# Patient Record
Sex: Female | Born: 1995 | Race: Black or African American | Hispanic: No | Smoking: Never smoker
Health system: Southern US, Community
[De-identification: ages and names within clinical notes are randomized; demographics above are authoritative.]

## PROBLEM LIST (undated history)

## (undated) ENCOUNTER — Inpatient Hospital Stay (HOSPITAL_COMMUNITY): Payer: Self-pay

## (undated) DIAGNOSIS — A749 Chlamydial infection, unspecified: Secondary | ICD-10-CM

## (undated) DIAGNOSIS — E669 Obesity, unspecified: Secondary | ICD-10-CM

## (undated) DIAGNOSIS — Z789 Other specified health status: Secondary | ICD-10-CM

## (undated) HISTORY — DX: Obesity, unspecified: E66.9

## (undated) HISTORY — PX: NO PAST SURGERIES: SHX2092

## (undated) HISTORY — DX: Other specified health status: Z78.9

---
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
## 2017-07-10 ENCOUNTER — Encounter: Payer: Self-pay | Admitting: General Practice

## 2017-07-10 ENCOUNTER — Inpatient Hospital Stay (HOSPITAL_COMMUNITY)
Admission: AD | Admit: 2017-07-10 | Discharge: 2017-07-10 | Disposition: A | Discharge status: Home / Self Care | Payer: Medicaid Other | Source: Ambulatory Visit | Attending: Family Medicine | Admitting: Family Medicine

## 2017-07-10 ENCOUNTER — Ambulatory Visit (INDEPENDENT_AMBULATORY_CARE_PROVIDER_SITE_OTHER): Payer: Self-pay | Admitting: General Practice

## 2017-07-10 DIAGNOSIS — Z3201 Encounter for pregnancy test, result positive: Secondary | ICD-10-CM

## 2017-07-10 DIAGNOSIS — N912 Amenorrhea, unspecified: Secondary | ICD-10-CM

## 2017-07-10 LAB — POCT PREGNANCY, URINE: PREG TEST UR: POSITIVE — AB

## 2017-07-10 NOTE — Progress Notes (Signed)
Patient here for UPT today. UPT +. Patient reports first positive home test yesterday. Reports LMP 05/31/17 5563w5d today EDD 03/07/18. Patient informed. Patient denies current use of meds/vitamins. Encouraged her to start PNV & care. Patient wishes to come here. New OB packet & baby scripts info given as well as pregnancy verification letter. Patient asked when she could receive first ultrasound. I told patient it was possible we would do an informal ultrasound in office the day of her new OB appt but I wasn't certain. Explained to patient she isn't far enough along right now for ultrasound. Patient verbalized understanding and had no questions

## 2017-07-10 NOTE — MAU Provider Note (Signed)
[name] is a 21 y.o. No obstetric history on file. who present to MAU today for pregnancy confirmation. She denies abdominal pain or vaginal bleeding.   BP 128/71 (BP Location: Left Arm)   Pulse 82   Temp 98.3 F (36.8 C) (Oral)   Resp 16   Wt 204 lb 0.6 oz (92.6 kg)   LMP 05/28/2017   SpO2 100%  CONSTITUTIONAL: Well-developed, well-nourished female in no acute distress.  CARDIOVASCULAR: Normal heart rate noted RESPIRATORY: Effort and breath sounds normal GASTROINTESTINAL:Soft, no distention noted.  No tenderness, rebound or guarding.  SKIN: Skin is warm and dry. No rash noted. Not diaphoretic. No erythema. No pallor. PSYCHIATRIC: Normal mood and affect. Normal behavior. Normal judgment and thought content.  MDM Medical screening exam complete Patient does not endorse any symptoms concerning for ectopic pregnancy or pregnancy related complication today.   A:  Amenorrhea  P: Discharge home Patient advised that she can present as a walk-in to [facility] for a pregnancy test M-Th between 8am-4pm or Friday between 8am -11am Reasons to return to MAU reviewed  Patient may return to MAU as needed or if her condition were to change or worsen  Judeth HornLawrence, Angelica Gulick, NP 07/10/2017 10:26 AM

## 2017-07-10 NOTE — MAU Note (Addendum)
Patient seeking pregnancy test. +HPT  +vaginal discharge White Thin No odor But states is not concerned about it.  +lower abdominal pain Cramping Intermittent Started last week of July No pain at present time.  LMP June 25

## 2017-07-30 ENCOUNTER — Inpatient Hospital Stay (HOSPITAL_COMMUNITY)
Admission: AD | Admit: 2017-07-30 | Discharge: 2017-07-30 | Disposition: A | Discharge status: Home / Self Care | Payer: Medicaid Other | Source: Ambulatory Visit | Attending: Obstetrics & Gynecology | Admitting: Obstetrics & Gynecology

## 2017-07-30 DIAGNOSIS — O99341 Other mental disorders complicating pregnancy, first trimester: Secondary | ICD-10-CM

## 2017-07-30 DIAGNOSIS — F419 Anxiety disorder, unspecified: Secondary | ICD-10-CM | POA: Diagnosis present

## 2017-07-30 DIAGNOSIS — Z3A01 Less than 8 weeks gestation of pregnancy: Secondary | ICD-10-CM | POA: Diagnosis not present

## 2017-07-30 NOTE — MAU Note (Signed)
Pt wants to check on baby, was last here at 5 weeks, had incident with her father 2 weeks ago, was under a lot of stress, is worried about baby since then.  Has mild abd pain every 2 days or so, none today.  Denies bleeding.

## 2017-07-30 NOTE — MAU Provider Note (Signed)
Patient MAGEN VOLIN is a 21 y.o. G1P0 At [redacted]w[redacted]d here because she had an argument with her dad a few weeks ago and she is stressed. She wants to check on the baby. She has called the [facility] and was told she can't have an appt until September.    History     CSN: [id number]  Arrival date and time: 07/30/17 1341   None     No chief complaint on file.  HPI  OB History    Gravida Para Term Preterm AB Living   1             SAB TAB Ectopic Multiple Live Births                  No past medical history on file.  No past surgical history on file.  No family history on file.  Social History  Substance Use Topics  . Smoking status: Not on file  . Smokeless tobacco: Not on file  . Alcohol use Not on file    Allergies: No Known Allergies  No prescriptions prior to admission.    Review of Systems  Constitutional: Negative.   HENT: Negative.   Respiratory: Negative.   Cardiovascular: Negative.   Gastrointestinal: Negative.   Genitourinary: Negative.   Musculoskeletal: Negative.   Psychiatric/Behavioral: Negative.    Physical Exam   Blood pressure 116/65, pulse 93, temperature 98 F (36.7 C), temperature source Oral, resp. rate 16, last menstrual period 05/31/2017.  Physical Exam  Constitutional: She is oriented to person, place, and time. She appears well-developed and well-nourished.  HENT:  Head: Normocephalic.  Neck: Normal range of motion.  Respiratory: Effort normal.  GI: Soft. Bowel sounds are normal.  Musculoskeletal: Normal range of motion.  Neurological: She is alert and oriented to person, place, and time.  Skin: Skin is warm and dry.  Psychiatric: She has a normal mood and affect.    MAU Course  Procedures  MDM -medical screen complete -patient denies bleeding, abdominal pain, discharge, n/v, dysuria or other ob-gyn complaint.  -I provided reassurance that her argument with her father would not cause a miscarriage.  Assessment and Plan    1. Anxiety during pregnancy, antepartum, first trimester    2. Patient stable for discharge.  3. Gave patient number for [facility] and encouraged her to call for an appt.  4. Reviewed when to return to the MAU (bleeding, abdominal pain, severe N/V) Charlesetta Garibaldi Shalanda Brogden 07/30/2017, 3:52 PM

## 2017-08-29 ENCOUNTER — Encounter: Payer: Self-pay | Admitting: Advanced Practice Midwife

## 2017-08-29 ENCOUNTER — Ambulatory Visit (INDEPENDENT_AMBULATORY_CARE_PROVIDER_SITE_OTHER): Payer: Self-pay | Admitting: Advanced Practice Midwife

## 2017-08-29 VITALS — BP 136/76 | HR 97 | Ht 63.0 in | Wt 206.8 lb

## 2017-08-29 DIAGNOSIS — Z113 Encounter for screening for infections with a predominantly sexual mode of transmission: Secondary | ICD-10-CM

## 2017-08-29 DIAGNOSIS — O09899 Supervision of other high risk pregnancies, unspecified trimester: Secondary | ICD-10-CM | POA: Insufficient documentation

## 2017-08-29 DIAGNOSIS — O09891 Supervision of other high risk pregnancies, first trimester: Secondary | ICD-10-CM

## 2017-08-29 DIAGNOSIS — Z3400 Encounter for supervision of normal first pregnancy, unspecified trimester: Secondary | ICD-10-CM | POA: Insufficient documentation

## 2017-08-29 DIAGNOSIS — Z3401 Encounter for supervision of normal first pregnancy, first trimester: Secondary | ICD-10-CM

## 2017-08-29 DIAGNOSIS — Z23 Encounter for immunization: Secondary | ICD-10-CM

## 2017-08-29 LAB — POCT URINALYSIS DIP (DEVICE)
Bilirubin Urine: NEGATIVE
GLUCOSE, UA: NEGATIVE mg/dL
Hgb urine dipstick: NEGATIVE
Ketones, ur: NEGATIVE mg/dL
NITRITE: NEGATIVE
PH: 7.5 (ref 5.0–8.0)
PROTEIN: NEGATIVE mg/dL
Specific Gravity, Urine: 1.02 (ref 1.005–1.030)
Urobilinogen, UA: 0.2 mg/dL (ref 0.0–1.0)

## 2017-08-29 NOTE — Progress Notes (Addendum)
   PRENATAL VISIT NOTE  Subjective:  Angelica Cruz is a 21 y.o. G1P0 at [redacted]w[redacted]d being seen today for initial visit prenatal care.  She is currently monitored for the following issues for this low-risk pregnancy and has Supervision of normal first pregnancy, antepartum on her problem list.  Patient reports no complaints.   . Vag. Bleeding: None.   . Denies leaking of fluid.   The following portions of the patient's history were reviewed and updated as appropriate: allergies, current medications, past family history, past medical history, past social history, past surgical history and problem list. Problem list updated.  Objective:   Vitals:   08/29/17 1005 08/29/17 1012  BP: 136/76   Pulse: 97   Weight: 206 lb 12.8 oz (93.8 kg)   Height:   (1.6 m)    Fetal Status: Fetal Heart Rate (bpm): 150         General:  Alert, oriented and cooperative. Patient is in no acute distress.  Skin: Skin is warm and dry. No rash noted.   Cardiovascular: Normal heart rate noted  Respiratory: Normal respiratory effort, no problems with respiration noted  Abdomen: Soft, gravid, appropriate for gestational age.  Pain/Pressure: Present     Pelvic: Cervical exam deferred        Extremities: Normal range of motion.  Edema: None  Mental Status:  Normal mood and affect. Normal behavior. Normal judgment and thought content.   Assessment and Plan:  Pregnancy: G1P0 at [redacted]w[redacted]d  1. Supervision of normal first pregnancy, antepartum - Cervicovaginal ancillary only - Obstetric Panel, Including HIV - Hemoglobinopathy Evaluation - Culture, OB Urine - Glucose Tolerance, 1 Hour  - Enroll Patient in Babyscripts - Babyscripts Schedule Optimization - did not discuss genetic screening. Please discuss quad screen at next visit.  2. Need for immunization against influenza - Flu Vaccine QUAD 6+ mos IM (Fluarix)  General obstetric precautions including but not limited to vaginal bleeding, contractions, leaking  of fluid and fetal movement were reviewed in detail with the patient. Please refer to After Visit Summary for other counseling recommendations.  Return in about 4 weeks (around 09/26/2017).   Rolm Bookbinder, DO

## 2017-08-29 NOTE — Patient Instructions (Signed)
First Trimester of Pregnancy The first trimester of pregnancy is from week 1 until the end of week 13 (months 1 through 3). During this time, your baby will begin to develop inside you. At 6-8 weeks, the eyes and face are formed, and the heartbeat can be seen on ultrasound. At the end of 12 weeks, all the baby's organs are formed. Prenatal care is all the medical care you receive before the birth of your baby. Make sure you get good prenatal care and follow all of your doctor's instructions. Follow these instructions at home: Medicines  Take over-the-counter and prescription medicines only as told by your doctor. Some medicines are safe and some medicines are not safe during pregnancy.  Take a prenatal vitamin that contains at least 600 micrograms (mcg) of folic acid.  If you have trouble pooping (constipation), take medicine that will make your stool soft (stool softener) if your doctor approves. Eating and drinking  Eat regular, healthy meals.  Your doctor will tell you the amount of weight gain that is right for you.  Avoid raw meat and uncooked cheese.  If you feel sick to your stomach (nauseous) or throw up (vomit): ? Eat 4 or 5 small meals a day instead of 3 large meals. ? Try eating a few soda crackers. ? Drink liquids between meals instead of during meals.  To prevent constipation: ? Eat foods that are high in fiber, like fresh fruits and vegetables, whole grains, and beans. ? Drink enough fluids to keep your pee (urine) clear or pale yellow. Activity  Exercise only as told by your doctor. Stop exercising if you have cramps or pain in your lower belly (abdomen) or low back.  Do not exercise if it is too hot, too humid, or if you are in a place of great height (high altitude).  Try to avoid standing for long periods of time. Move your legs often if you must stand in one place for a long time.  Avoid heavy lifting.  Wear low-heeled shoes. Sit and stand up straight.  You  can have sex unless your doctor tells you not to. Relieving pain and discomfort  Wear a good support bra if your breasts are sore.  Take warm water baths (sitz baths) to soothe pain or discomfort caused by hemorrhoids. Use hemorrhoid cream if your doctor says it is okay.  Rest with your legs raised if you have leg cramps or low back pain.  If you have puffy, bulging veins (varicose veins) in your legs: ? Wear support hose or compression stockings as told by your doctor. ? Raise (elevate) your feet for 15 minutes, 3-4 times a day. ? Limit salt in your food. Prenatal care  Schedule your prenatal visits by the twelfth week of pregnancy.  Write down your questions. Take them to your prenatal visits.  Keep all your prenatal visits as told by your doctor. This is important. Safety  Wear your seat belt at all times when driving.  Make a list of emergency phone numbers. The list should include numbers for family, friends, the hospital, and police and fire departments. General instructions  Ask your doctor for a referral to a local prenatal class. Begin classes no later than at the start of month 6 of your pregnancy.  Ask for help if you need counseling or if you need help with nutrition. Your doctor can give you advice or tell you where to go for help.  Do not use hot tubs, steam rooms, or   saunas.  Do not douche or use tampons or scented sanitary pads.  Do not cross your legs for long periods of time.  Avoid all herbs and alcohol. Avoid drugs that are not approved by your doctor.  Do not use any tobacco products, including cigarettes, chewing tobacco, and electronic cigarettes. If you need help quitting, ask your doctor. You may get counseling or other support to help you quit.  Avoid cat litter boxes and soil used by cats. These carry germs that can cause birth defects in the baby and can cause a loss of your baby (miscarriage) or stillbirth.  Visit your dentist. At home, brush  your teeth with a soft toothbrush. Be gentle when you floss. Contact a doctor if:  You are dizzy.  You have mild cramps or pressure in your lower belly.  You have a nagging pain in your belly area.  You continue to feel sick to your stomach, you throw up, or you have watery poop (diarrhea).  You have a bad smelling fluid coming from your vagina.  You have pain when you pee (urinate).  You have increased puffiness (swelling) in your face, hands, legs, or ankles. Get help right away if:  You have a fever.  You are leaking fluid from your vagina.  You have spotting or bleeding from your vagina.  You have very bad belly cramping or pain.  You gain or lose weight rapidly.  You throw up blood. It may look like coffee grounds.  You are around people who have German measles, fifth disease, or chickenpox.  You have a very bad headache.  You have shortness of breath.  You have any kind of trauma, such as from a fall or a car accident. Summary  The first trimester of pregnancy is from week 1 until the end of week 13 (months 1 through 3).  To take care of yourself and your unborn baby, you will need to eat healthy meals, take medicines only if your doctor tells you to do so, and do activities that are safe for you and your baby.  Keep all follow-up visits as told by your doctor. This is important as your doctor will have to ensure that your baby is healthy and growing well. This information is not intended to replace advice given to you by your health care provider. Make sure you discuss any questions you have with your health care provider. Document Released: 05/08/2008 Document Revised: 11/28/2016 Document Reviewed: 11/28/2016 Elsevier Interactive Patient Education  2017 [employer]  

## 2017-08-29 NOTE — Progress Notes (Signed)
Patient has filled out Medicaid home risk screening form

## 2017-08-30 ENCOUNTER — Encounter: Payer: Self-pay | Admitting: *Deleted

## 2017-08-30 LAB — CERVICOVAGINAL ANCILLARY ONLY
Chlamydia: POSITIVE — AB
NEISSERIA GONORRHEA: NEGATIVE

## 2017-08-31 ENCOUNTER — Other Ambulatory Visit: Payer: Self-pay

## 2017-08-31 DIAGNOSIS — A749 Chlamydial infection, unspecified: Secondary | ICD-10-CM

## 2017-08-31 LAB — OBSTETRIC PANEL, INCLUDING HIV
ANTIBODY SCREEN: NEGATIVE
BASOS: 0 %
Basophils Absolute: 0 10*3/uL (ref 0.0–0.2)
EOS (ABSOLUTE): 0.1 10*3/uL (ref 0.0–0.4)
Eos: 1 %
HIV SCREEN 4TH GENERATION: NONREACTIVE
Hematocrit: 33.3 % — ABNORMAL LOW (ref 34.0–46.6)
Hemoglobin: 11.1 g/dL (ref 11.1–15.9)
Hepatitis B Surface Ag: NEGATIVE
Immature Grans (Abs): 0 10*3/uL (ref 0.0–0.1)
Immature Granulocytes: 0 %
Lymphocytes Absolute: 1.8 10*3/uL (ref 0.7–3.1)
Lymphs: 30 %
MCH: 27.5 pg (ref 26.6–33.0)
MCHC: 33.3 g/dL (ref 31.5–35.7)
MCV: 82 fL (ref 79–97)
Monocytes Absolute: 0.7 10*3/uL (ref 0.1–0.9)
Monocytes: 11 %
NEUTROS ABS: 3.6 10*3/uL (ref 1.4–7.0)
Neutrophils: 58 %
Platelets: 296 10*3/uL (ref 150–379)
RBC: 4.04 x10E6/uL (ref 3.77–5.28)
RDW: 15.7 % — AB (ref 12.3–15.4)
RH TYPE: POSITIVE
RPR Ser Ql: NONREACTIVE
RUBELLA: 1.93 {index} (ref >0.99)
WBC: 6.2 10*3/uL (ref 3.4–10.8)

## 2017-08-31 LAB — HEMOGLOBINOPATHY EVALUATION
Ferritin: 11 ng/mL — ABNORMAL LOW (ref 15–77)
HGB A2 QUANT: 2.6 % (ref 1.8–3.2)
HGB C: 0 %
HGB S: 0 %
HGB SOLUBILITY: NEGATIVE
HGB VARIANT: 0 %
Hgb A: 97.4 % (ref 96.4–98.8)
Hgb F Quant: 0 % (ref 0.0–2.0)

## 2017-08-31 LAB — CULTURE, OB URINE

## 2017-08-31 LAB — URINE CULTURE, OB REFLEX

## 2017-08-31 LAB — GLUCOSE TOLERANCE, 1 HOUR: Glucose, 1Hr PP: 66 mg/dL (ref 65–199)

## 2017-08-31 MED ORDER — AZITHROMYCIN 250 MG PO TABS: 1000.0000 mg | ORAL_TABLET | Freq: Once | ORAL | 0 refills | Status: AC

## 2017-08-31 NOTE — Progress Notes (Signed)
Called patient to inform her of her positive Chlamydia test results. Advised patient to have partner treated.Patient verbalized understanding and had no questions. Medication was sent to pharmacy.

## 2017-09-01 ENCOUNTER — Encounter (HOSPITAL_COMMUNITY): Payer: Self-pay | Admitting: Advanced Practice Midwife

## 2017-09-01 DIAGNOSIS — O98819 Other maternal infectious and parasitic diseases complicating pregnancy, unspecified trimester: Secondary | ICD-10-CM

## 2017-09-01 DIAGNOSIS — A749 Chlamydial infection, unspecified: Secondary | ICD-10-CM | POA: Insufficient documentation

## 2017-09-10 ENCOUNTER — Telehealth: Payer: Self-pay | Admitting: *Deleted

## 2017-09-10 NOTE — Telephone Encounter (Signed)
-----   Message from [place], [place] sent at 09/01/2017  9:23 PM EDT ----- Dx Chlamydia. Please Rx Azithromycin 1 gm PO x 1.

## 2017-09-10 NOTE — Telephone Encounter (Signed)
Per message from provider I called Angelica Cruz to notify of + for chlamydia and need for tx. She states she was called on 08/31/17 and took the medicine that day.  We also discussed that her partner should be treated and no sexual contact until both treated x 2 weeks. Per chart rx was sent in on 08/31/17. Discussed may do toc.

## 2017-09-10 NOTE — Telephone Encounter (Signed)
Addedum: Report send to health department.

## 2017-09-26 ENCOUNTER — Ambulatory Visit (INDEPENDENT_AMBULATORY_CARE_PROVIDER_SITE_OTHER): Payer: Medicaid Other | Admitting: Advanced Practice Midwife

## 2017-09-26 ENCOUNTER — Other Ambulatory Visit (HOSPITAL_COMMUNITY)
Admission: RE | Admit: 2017-09-26 | Discharge: 2017-09-26 | Disposition: A | Discharge status: Home / Self Care | Payer: Medicaid Other | Source: Ambulatory Visit | Attending: Advanced Practice Midwife | Admitting: Advanced Practice Midwife

## 2017-09-26 VITALS — BP 116/60 | HR 80 | Wt 210.0 lb

## 2017-09-26 DIAGNOSIS — Z3400 Encounter for supervision of normal first pregnancy, unspecified trimester: Secondary | ICD-10-CM

## 2017-09-26 DIAGNOSIS — O98812 Other maternal infectious and parasitic diseases complicating pregnancy, second trimester: Secondary | ICD-10-CM

## 2017-09-26 DIAGNOSIS — A749 Chlamydial infection, unspecified: Secondary | ICD-10-CM

## 2017-09-26 DIAGNOSIS — Z113 Encounter for screening for infections with a predominantly sexual mode of transmission: Secondary | ICD-10-CM | POA: Diagnosis not present

## 2017-09-26 DIAGNOSIS — Z3402 Encounter for supervision of normal first pregnancy, second trimester: Secondary | ICD-10-CM

## 2017-09-27 LAB — GC/CHLAMYDIA PROBE AMP (~~LOC~~) NOT AT ARMC
Chlamydia: NEGATIVE
Neisseria Gonorrhea: NEGATIVE

## 2017-09-27 NOTE — Progress Notes (Signed)
   PRENATAL VISIT NOTE  Subjective:  Angelica Cruz is a 21 y.o. G1P0 at 5325w6d being seen today for ongoing prenatal care.  She is currently monitored for the following issues for this low-risk pregnancy and has Supervision of normal first pregnancy, antepartum; High risk teen pregnancy; and Chlamydia infection affecting pregnancy on her problem list.  Patient reports no complaints.   . Vag. Bleeding: None.  Movement: Absent. Denies leaking of fluid.   The following portions of the patient's history were reviewed and updated as appropriate: allergies, current medications, past family history, past medical history, past social history, past surgical history and problem list. Problem list updated.  Objective:   Vitals:   09/26/17 1056  BP: 116/60  Pulse: 80  Weight: 210 lb (95.3 kg)    Fetal Status: Fetal Heart Rate (bpm): 147   Movement: Absent    Fundal Ht 19 cm  General:  Alert, oriented and cooperative. Patient is in no acute distress.  Skin: Skin is warm and dry. No rash noted.   Cardiovascular: Normal heart rate noted  Respiratory: Normal respiratory effort, no problems with respiration noted  Abdomen: Soft, gravid, appropriate for gestational age.  Pain/Pressure: Present     Pelvic: Cervical exam deferred      Blind vaginal swab for Chlamydia TOC.   Extremities: Normal range of motion.  Edema: None  Mental Status:  Normal mood and affect. Normal behavior. Normal judgment and thought content.   Assessment and Plan:  Pregnancy: G1P0 at 5856w0d  1. Chlamydia infection affecting pregnancy in second trimester - Pt Tx'd w/ Azithromycin. No longer w/ partner. No IC since Dx.  - GC[facility]Chlamydia probe amp [facility]  2. Supervision of normal first pregnancy, antepartum  - AFP TETRA - Anatomy US scheduled  Preterm labor symptoms and general obstetric precautions including but not limited to vaginal bleeding, contractions, leaking of fluid and fetal movement  were reviewed in detail with the patient. Please refer to After Visit Summary for other counseling recommendations.  F/U 4 weeks.    Dorathy KinsmanVirginia Lilyanna Cruz, CNM

## 2017-09-29 LAB — AFP TETRA
DIA Mom Value: 0.73
DIA VALUE (EIA): 99.65 pg/mL
DSR (BY AGE) 1 IN: 1159
DSR (SECOND TRIMESTER) 1 IN: 10000
Gestational Age: 16.6 WEEKS
MSAFP MOM: 0.95
MSAFP: 30.2 ng/mL
MSHCG Mom: 0.73
MSHCG: 19919 m[IU]/mL
Maternal Age At EDD: 20.3 yr
OSB RISK: 10000
Test Results:: NEGATIVE
UE3 MOM: 1
Weight: 210 [lb_av]
uE3 Value: 0.82 ng/mL

## 2017-10-04 ENCOUNTER — Ambulatory Visit (HOSPITAL_COMMUNITY)
Admission: RE | Admit: 2017-10-04 | Discharge: 2017-10-04 | Disposition: A | Discharge status: Home / Self Care | Payer: Medicaid Other | Source: Ambulatory Visit | Attending: Advanced Practice Midwife | Admitting: Advanced Practice Midwife

## 2017-10-04 DIAGNOSIS — Z3400 Encounter for supervision of normal first pregnancy, unspecified trimester: Secondary | ICD-10-CM | POA: Diagnosis present

## 2017-10-04 DIAGNOSIS — Z3A18 18 weeks gestation of pregnancy: Secondary | ICD-10-CM | POA: Insufficient documentation

## 2017-10-04 DIAGNOSIS — O99212 Obesity complicating pregnancy, second trimester: Secondary | ICD-10-CM | POA: Insufficient documentation

## 2017-10-04 DIAGNOSIS — Z363 Encounter for antenatal screening for malformations: Secondary | ICD-10-CM | POA: Insufficient documentation

## 2017-10-24 ENCOUNTER — Ambulatory Visit (INDEPENDENT_AMBULATORY_CARE_PROVIDER_SITE_OTHER): Payer: Medicaid Other | Admitting: Obstetrics and Gynecology

## 2017-10-24 VITALS — BP 113/56 | HR 86 | Wt 211.4 lb

## 2017-10-24 DIAGNOSIS — Z3400 Encounter for supervision of normal first pregnancy, unspecified trimester: Secondary | ICD-10-CM

## 2017-10-24 DIAGNOSIS — Z3689 Encounter for other specified antenatal screening: Secondary | ICD-10-CM

## 2017-10-24 NOTE — Patient Instructions (Signed)
Second Trimester of Pregnancy The second trimester is from week 13 through week 28, month 4 through 6. This is often the time in pregnancy that you feel your best. Often times, morning sickness has lessened or quit. You may have more energy, and you may get hungry more often. Your unborn baby (fetus) is growing rapidly. At the end of the sixth month, he or she is about 9 inches long and weighs about 1 pounds. You will likely feel the baby move (quickening) between 18 and 20 weeks of pregnancy. Follow these instructions at home:  Avoid all smoking, herbs, and alcohol. Avoid drugs not approved by your doctor.  Do not use any tobacco products, including cigarettes, chewing tobacco, and electronic cigarettes. If you need help quitting, ask your doctor. You may get counseling or other support to help you quit.  Only take medicine as told by your doctor. Some medicines are safe and some are not during pregnancy.  Exercise only as told by your doctor. Stop exercising if you start having cramps.  Eat regular, healthy meals.  Wear a good support bra if your breasts are tender.  Do not use hot tubs, steam rooms, or saunas.  Wear your seat belt when driving.  Avoid raw meat, uncooked cheese, and liter boxes and soil used by cats.  Take your prenatal vitamins.  Take 1500-2000 milligrams of calcium daily starting at the 20th week of pregnancy until you deliver your baby.  Try taking medicine that helps you poop (stool softener) as needed, and if your doctor approves. Eat more fiber by eating fresh fruit, vegetables, and whole grains. Drink enough fluids to keep your pee (urine) clear or pale yellow.  Take warm water baths (sitz baths) to soothe pain or discomfort caused by hemorrhoids. Use hemorrhoid cream if your doctor approves.  If you have puffy, bulging veins (varicose veins), wear support hose. Raise (elevate) your feet for 15 minutes, 3-4 times a day. Limit salt in your diet.  Avoid heavy  lifting, wear low heals, and sit up straight.  Rest with your legs raised if you have leg cramps or low back pain.  Visit your dentist if you have not gone during your pregnancy. Use a soft toothbrush to brush your teeth. Be gentle when you floss.  You can have sex (intercourse) unless your doctor tells you not to.  Go to your doctor visits. Get help if:  You feel dizzy.  You have mild cramps or pressure in your lower belly (abdomen).  You have a nagging pain in your belly area.  You continue to feel sick to your stomach (nauseous), throw up (vomit), or have watery poop (diarrhea).  You have bad smelling fluid coming from your vagina.  You have pain with peeing (urination). Get help right away if:  You have a fever.  You are leaking fluid from your vagina.  You have spotting or bleeding from your vagina.  You have severe belly cramping or pain.  You lose or gain weight rapidly.  You have trouble catching your breath and have chest pain.  You notice sudden or extreme puffiness (swelling) of your face, hands, ankles, feet, or legs.  You have not felt the baby move in over an hour.  You have severe headaches that do not go away with medicine.  You have vision changes. This information is not intended to replace advice given to you by your health care provider. Make sure you discuss any questions you have with your health care   provider. Document Released: 02/14/2010 Document Revised: 04/27/2016 Document Reviewed: 01/21/2013 Elsevier Interactive Patient Education  2017 Elsevier Inc.  

## 2017-10-24 NOTE — Progress Notes (Signed)
   PRENATAL VISIT NOTE  Subjective:  Angelica Cruz is a 21 y.o. G1P0 at 4346w6d being seen today for ongoing prenatal care.  She is currently monitored for the following issues for this low-risk pregnancy and has Supervision of normal first pregnancy, antepartum; High risk teen pregnancy; and Chlamydia infection affecting pregnancy on their problem list.  Patient reports no complaints.   . Vag. Bleeding: None.  Movement: Present. Denies leaking of fluid.   The following portions of the patient's history were reviewed and updated as appropriate: allergies, current medications, past family history, past medical history, past social history, past surgical history and problem list. Problem list updated.  Objective:   Vitals:   10/24/17 0845  BP: (!) 113/56  Pulse: 86  Weight: 211 lb 6.4 oz (95.9 kg)    Fetal Status: Fetal Heart Rate (bpm): 153 Fundal Height: 22 cm Movement: Present     General:  Alert, oriented and cooperative. Patient is in no acute distress.  Skin: Skin is warm and dry. No rash noted.   Cardiovascular: Normal heart rate noted  Respiratory: Normal respiratory effort, no problems with respiration noted  Abdomen: Soft, gravid, appropriate for gestational age.  Pain/Pressure: Present     Pelvic: Cervical exam deferred        Extremities: Normal range of motion.  Edema: None  Mental Status:  Normal mood and affect. Normal behavior. Normal judgment and thought content.   Assessment and Plan:  Pregnancy: G1P0 at 7446w6d  1. Screening, antenatal, for fetal anatomic survey - US MFM OB FOLLOW UP; Future -- 10/31/2017 @ 0945  Preterm labor symptoms and general obstetric precautions including but not limited to vaginal bleeding, contractions, leaking of fluid and fetal movement were reviewed in detail with the patient. Please refer to After Visit Summary for other counseling recommendations.  Return in about 4 weeks (around 11/21/2017) for Return OB visit.   Raelyn Moraolitta  Bransen Fassnacht, CNM

## 2017-10-31 ENCOUNTER — Ambulatory Visit (HOSPITAL_COMMUNITY)
Admission: RE | Admit: 2017-10-31 | Discharge: 2017-10-31 | Disposition: A | Discharge status: Home / Self Care | Payer: Medicaid Other | Source: Ambulatory Visit | Attending: Obstetrics and Gynecology | Admitting: Obstetrics and Gynecology

## 2017-10-31 ENCOUNTER — Other Ambulatory Visit: Payer: Self-pay | Admitting: Obstetrics and Gynecology

## 2017-10-31 DIAGNOSIS — IMO0002 Reserved for concepts with insufficient information to code with codable children: Secondary | ICD-10-CM

## 2017-10-31 DIAGNOSIS — Z3689 Encounter for other specified antenatal screening: Secondary | ICD-10-CM

## 2017-10-31 DIAGNOSIS — O99212 Obesity complicating pregnancy, second trimester: Secondary | ICD-10-CM

## 2017-10-31 DIAGNOSIS — Z3A21 21 weeks gestation of pregnancy: Secondary | ICD-10-CM | POA: Insufficient documentation

## 2017-10-31 DIAGNOSIS — Z362 Encounter for other antenatal screening follow-up: Secondary | ICD-10-CM | POA: Diagnosis not present

## 2017-10-31 DIAGNOSIS — Z0489 Encounter for examination and observation for other specified reasons: Secondary | ICD-10-CM

## 2017-10-31 DIAGNOSIS — E669 Obesity, unspecified: Secondary | ICD-10-CM | POA: Insufficient documentation

## 2017-11-22 ENCOUNTER — Other Ambulatory Visit (HOSPITAL_COMMUNITY)
Admission: RE | Admit: 2017-11-22 | Discharge: 2017-11-22 | Disposition: A | Discharge status: Home / Self Care | Payer: Medicaid Other | Source: Ambulatory Visit | Attending: Advanced Practice Midwife | Admitting: Advanced Practice Midwife

## 2017-11-22 ENCOUNTER — Encounter: Payer: Self-pay | Admitting: Advanced Practice Midwife

## 2017-11-22 ENCOUNTER — Ambulatory Visit (INDEPENDENT_AMBULATORY_CARE_PROVIDER_SITE_OTHER): Payer: Medicaid Other | Admitting: Advanced Practice Midwife

## 2017-11-22 VITALS — BP 112/58 | HR 93 | Wt 215.6 lb

## 2017-11-22 DIAGNOSIS — Z202 Contact with and (suspected) exposure to infections with a predominantly sexual mode of transmission: Secondary | ICD-10-CM

## 2017-11-22 DIAGNOSIS — Z3400 Encounter for supervision of normal first pregnancy, unspecified trimester: Secondary | ICD-10-CM

## 2017-11-22 DIAGNOSIS — Z3A00 Weeks of gestation of pregnancy not specified: Secondary | ICD-10-CM | POA: Insufficient documentation

## 2017-11-22 DIAGNOSIS — Z3402 Encounter for supervision of normal first pregnancy, second trimester: Secondary | ICD-10-CM

## 2017-11-22 NOTE — Patient Instructions (Signed)
AREA PEDIATRIC/FAMILY PRACTICE PHYSICIANS  [facility] [address], [address] [place], [place]  [address] Phone - [phone]   Fax - [phone]  [facility] [address] [place], [place] [address] Phone - [phone]   Fax - [phone]  [facility] [address] [place], [place]  [address] Phone - [phone]   Fax - [phone]  [facility] [address] [place], [place]  [address] Phone - [phone]   Fax - [phone]  [facility] [address] [place], [place]  [address] Phone - [phone]   Fax - [phone]  [facility] [address] [place], [place]  [address] Phone - [phone]   Fax - [phone]  [facility] [address] [place], [place]  [address] Phone - [phone]   Fax - [phone]  [facility] [address] [place], [place]  [address] Phone - [phone]   Fax - [phone]  [facility] [address] [place], [place]  [address] Phone - [phone]   Fax - [phone] [facility] [address] [place], [place]  [address] Phone - [phone]   Fax - [phone]  [facility] [address] [place], [place]  [address] Phone - [phone]   Fax - [phone]  [facility] [address] [place], [place]  [address] Phone - [phone]   Fax - [phone]  [facility] [address] [place], [place]  [address] Phone - [phone]   Fax - [phone]  [facility] GOSR[facility] [address] [place], [place]  [address] Phone - [phone]  [facility] [address] [place], [place]  [address] Phone - [phone]   Fax - [phone]  [facility] [address] [place], [place]  [address] Phone - [phone]   Fax - [phone]  [facility] [address] [place], [place]  [address] Phone - [phone]   Fax - [phone]  [facility] [address] [place], [place]  [address] Phone - [phone]   Fax - [phone]   [facility] [address] [place], [place]  [address] Phone - [phone]   Fax - [phone]  [facility] [address] [place], [place]  [address] Phone - [phone]   Fax - [phone]  [facility] RUB[facility] [address] [place], [place]  [address] Phone - [phone]   Fax - [phone]  [facility] [address] [place], [place]  [address] Phone - [phone]   Fax - [phone]  [facility] [address] , [place]  [address] Phone - [phone]   Fax - [phone] [facility] [address] [place], [place]  [address] Phone - [phone]   Fax - [phone]  [facility] [address] [place], [place]  [address] Phone - [phone]   Fax - [phone]  [facility] [address] [place], [place]  [address] Phone - [phone]   Fax - [phone]  [facility] Charlene Flemming MD [address] [place]  [address] Phone [phone]  Fax [phone]   Contraception Choices Contraception, also called birth control, refers to methods or devices that prevent   pregnancy. Hormonal methods Contraceptive implant A contraceptive implant is a thin, plastic tube that contains a hormone. It is inserted into the upper part of the arm. It can remain in place for up to 3 years. Progestin-only injections Progestin-only injections are injections of progestin, a synthetic form of the hormone progesterone. They are given every 3 months by a health care  provider. Birth control pills Birth control pills are pills that contain hormones that prevent pregnancy. They must be taken once a day, preferably at the same time each day. Birth control patch The birth control patch contains hormones that prevent pregnancy. It is placed on the skin and must be changed once a week for three weeks and removed on the fourth week. A prescription is needed to use this method of contraception. Vaginal ring A vaginal ring contains hormones that prevent pregnancy. It is placed in the vagina for three weeks and removed on the fourth week. After that, the process is repeated with a new ring. A prescription is needed to use this method of contraception. Emergency contraceptive Emergency contraceptives prevent pregnancy after unprotected sex. They come in pill form and can be taken up to 5 days after sex. They work best the sooner they are taken after having sex. Most emergency contraceptives are available without a prescription. This method should not be used as your only form of birth control. Barrier methods Female condom A female condom is a thin sheath that is worn over the penis during sex. Condoms keep sperm from going inside a woman's body. They can be used with a spermicide to increase their effectiveness. They should be disposed after a single use. Female condom A female condom is a soft, loose-fitting sheath that is put into the vagina before sex. The condom keeps sperm from going inside a woman's body. They should be disposed after a single use. Diaphragm A diaphragm is a soft, dome-shaped barrier. It is inserted into the vagina before sex, along with a spermicide. The diaphragm blocks sperm from entering the uterus, and the spermicide kills sperm. A diaphragm should be left in the vagina for 6-8 hours after sex and removed within 24 hours. A diaphragm is prescribed and fitted by a health care provider. A diaphragm should be replaced every 1-2 years, after giving  birth, after gaining more than 15 lb (6.8 kg), and after pelvic surgery. Cervical cap A cervical cap is a round, soft latex or plastic cup that fits over the cervix. It is inserted into the vagina before sex, along with spermicide. It blocks sperm from entering the uterus. The cap should be left in place for 6-8 hours after sex and removed within 48 hours. A cervical cap must be prescribed and fitted by a health care provider. It should be replaced every 2 years. Sponge A sponge is a soft, circular piece of polyurethane foam with spermicide on it. The sponge helps block sperm from entering the uterus, and the spermicide kills sperm. To use it, you make it wet and then insert it into the vagina. It should be inserted before sex, left in for at least 6 hours after sex, and removed and thrown away within 30 hours. Spermicides Spermicides are chemicals that kill or block sperm from entering the cervix and uterus. They can come as a cream, jelly, suppository, foam, or tablet. A spermicide should be inserted into the vagina with an applicator at least 10-15 minutes before sex to allow time for it to work. The process must be repeated   every time you have sex. Spermicides do not require a prescription. Intrauterine contraception Intrauterine device (IUD) An IUD is a T-shaped device that is put in a woman's uterus. There are two types:  Hormone IUD.This type contains progestin, a synthetic form of the hormone progesterone. This type can stay in place for 3-5 years.  Copper IUD.This type is wrapped in copper wire. It can stay in place for 10 years.  Permanent methods of contraception Female tubal ligation In this method, a woman's fallopian tubes are sealed, tied, or blocked during surgery to prevent eggs from traveling to the uterus. Hysteroscopic sterilization In this method, a small, flexible insert is placed into each fallopian tube. The inserts cause scar tissue to form in the fallopian tubes and block  them, so sperm cannot reach an egg. The procedure takes about 3 months to be effective. Another form of birth control must be used during those 3 months. Female sterilization This is a procedure to tie off the tubes that carry sperm (vasectomy). After the procedure, the man can still ejaculate fluid (semen). Natural planning methods Natural family planning In this method, a couple does not have sex on days when the woman could become pregnant. Calendar method This means keeping track of the length of each menstrual cycle, identifying the days when pregnancy can happen, and not having sex on those days. Ovulation method In this method, a couple avoids sex during ovulation. Symptothermal method This method involves not having sex during ovulation. The woman typically checks for ovulation by watching changes in her temperature and in the consistency of cervical mucus. Post-ovulation method In this method, a couple waits to have sex until after ovulation. Summary  Contraception, also called birth control, means methods or devices that prevent pregnancy.  Hormonal methods of contraception include implants, injections, pills, patches, vaginal rings, and emergency contraceptives.  Barrier methods of contraception can include female condoms, female condoms, diaphragms, cervical caps, sponges, and spermicides.  There are two types of IUDs (intrauterine devices). An IUD can be put in a woman's uterus to prevent pregnancy for 3-5 years.  Permanent sterilization can be done through a procedure for males, females, or both.  Natural family planning methods involve not having sex on days when the woman could become pregnant. This information is not intended to replace advice given to you by your health care provider. Make sure you discuss any questions you have with your health care provider. Document Released: 11/20/2005 Document Revised: 12/23/2016 Document Reviewed: 12/23/2016 Elsevier Interactive  Patient Education  2018 Elsevier Inc.  

## 2017-11-22 NOTE — Progress Notes (Signed)
   PRENATAL VISIT NOTE  Subjective:  Angelica Cruz is a 21 y.o. G1P0 at 647w0d being seen today for ongoing prenatal care.  She is currently monitored for the following issues for this low-risk pregnancy and has Supervision of normal first pregnancy, antepartum and Chlamydia infection affecting pregnancy on their problem list.  Patient reports no complaints. Requesting STD testing. No Sx.  Contractions: Not present. Vag. Bleeding: None.  Movement: Present. Denies leaking of fluid.   The following portions of the patient's history were reviewed and updated as appropriate: allergies, current medications, past family history, past medical history, past social history, past surgical history and problem list. Problem list updated.  Objective:   Vitals:   11/22/17 1010  BP: (!) 112/58  Pulse: 93  Weight: 215 lb 9.6 oz (97.8 kg)    Fetal Status: Fetal Heart Rate (bpm): 142 Fundal Height: 26 cm Movement: Present     General:  Alert, oriented and cooperative. Patient is in no acute distress.  Skin: Skin is warm and dry. No rash noted.   Cardiovascular: Normal heart rate noted  Respiratory: Normal respiratory effort, no problems with respiration noted  Abdomen: Soft, gravid, appropriate for gestational age.  Pain/Pressure: Absent     Pelvic: Cervical exam deferred        Extremities: Normal range of motion.  Edema: Trace  Mental Status:  Normal mood and affect. Normal behavior. Normal judgment and thought content.   Assessment and Plan:  Pregnancy: G1P0 at 717w0d  1. Supervision of normal first pregnancy, antepartum  - US MFM OB FOLLOW UP; Future  2. Possible STD exposure  - HIV antibody - RPR - Hepatitis B surface antibody - Cervicovaginal ancillary only  Preterm labor symptoms and general obstetric precautions including but not limited to vaginal bleeding, contractions, leaking of fluid and fetal movement were reviewed in detail with the patient. Please refer to After Visit  Summary for other counseling recommendations.  Return in about 3 weeks (around 12/13/2017) for ROB/GTT.  Instructed to fast for GTT.    Dorathy KinsmanVirginia Nguyen Todorov, CNM

## 2017-11-23 LAB — RPR: RPR: NONREACTIVE

## 2017-11-23 LAB — CERVICOVAGINAL ANCILLARY ONLY
Chlamydia: NEGATIVE
NEISSERIA GONORRHEA: NEGATIVE
TRICH (WINDOWPATH): NEGATIVE

## 2017-11-23 LAB — HEPATITIS B SURFACE ANTIBODY, QUANTITATIVE: Hepatitis B Surf Ab Quant: 4.7 m[IU]/mL — ABNORMAL LOW (ref >9.9)

## 2017-11-23 LAB — HIV ANTIBODY (ROUTINE TESTING W REFLEX): HIV Screen 4th Generation wRfx: NONREACTIVE

## 2017-12-04 NOTE — L&D Delivery Note (Signed)
Patient: Angelica Cruz MRN: [id number]  GBS status: positive, inadequate IAP given (penicillin x 1, 20 min prior to delivery)  Patient is a 22 y.o. now G1P1001 s/p NSVD at 3526w5d, who was admitted for SOL; progressed quickly to complete shortly after admission. AROM 0h 9165m prior to delivery with moderate meconium fluid.   Delivery Note At 1:00 PM a viable female was delivered via Vaginal, Spontaneous (Presentation: OA; ROA ).  APGAR: 8, 9; weight  pending  Placenta status: intact,  Cord: 3-vessel  Anesthesia:  Non Episiotomy: None Lacerations: 1st degree;Perineal Suture Repair: 3.0 vicryl Est. Blood Loss (mL):  200  Mom to postpartum.  Baby to Couplet care / Skin to Skin.Raynelle Fanning.  Amontae Ng P. Krist Rosenboom, MD OB Fellow 03/05/18, 1:45 PM

## 2017-12-05 ENCOUNTER — Ambulatory Visit (HOSPITAL_COMMUNITY)
Admission: RE | Admit: 2017-12-05 | Discharge: 2017-12-05 | Disposition: A | Discharge status: Home / Self Care | Payer: Medicaid Other | Source: Ambulatory Visit | Attending: Advanced Practice Midwife | Admitting: Advanced Practice Midwife

## 2017-12-05 ENCOUNTER — Other Ambulatory Visit: Payer: Self-pay | Admitting: Advanced Practice Midwife

## 2017-12-05 DIAGNOSIS — Z362 Encounter for other antenatal screening follow-up: Secondary | ICD-10-CM | POA: Diagnosis not present

## 2017-12-05 DIAGNOSIS — Z3A26 26 weeks gestation of pregnancy: Secondary | ICD-10-CM

## 2017-12-05 DIAGNOSIS — O99212 Obesity complicating pregnancy, second trimester: Secondary | ICD-10-CM

## 2017-12-05 DIAGNOSIS — Z3400 Encounter for supervision of normal first pregnancy, unspecified trimester: Secondary | ICD-10-CM

## 2017-12-13 ENCOUNTER — Encounter: Payer: Self-pay | Admitting: Certified Nurse Midwife

## 2017-12-13 ENCOUNTER — Ambulatory Visit (INDEPENDENT_AMBULATORY_CARE_PROVIDER_SITE_OTHER): Payer: Medicaid Other | Admitting: Certified Nurse Midwife

## 2017-12-13 VITALS — BP 96/59 | HR 113 | Wt 217.7 lb

## 2017-12-13 DIAGNOSIS — Z3400 Encounter for supervision of normal first pregnancy, unspecified trimester: Secondary | ICD-10-CM

## 2017-12-13 DIAGNOSIS — O98813 Other maternal infectious and parasitic diseases complicating pregnancy, third trimester: Secondary | ICD-10-CM

## 2017-12-13 DIAGNOSIS — A749 Chlamydial infection, unspecified: Secondary | ICD-10-CM

## 2017-12-13 NOTE — Progress Notes (Signed)
   PRENATAL VISIT NOTE  Subjective:  Angelica Cruz is a 22 y.o. G1P0 at 214w0d being seen today for ongoing prenatal care.  She is currently monitored for the following issues for this low-risk pregnancy and has Supervision of normal first pregnancy, antepartum and Chlamydia infection affecting pregnancy on their problem list.  Patient reports no complaints.  Contractions: Not present. Vag. Bleeding: None.  Movement: Present. Denies leaking of fluid.   The following portions of the patient's history were reviewed and updated as appropriate: allergies, current medications, past family history, past medical history, past social history, past surgical history and problem list. Problem list updated.  Objective:   Vitals:   12/13/17 0835  BP: (!) 96/59  Pulse: (!) 113  Weight: 217 lb 11.2 oz (98.7 kg)    Fetal Status: Fetal Heart Rate (bpm): 154 Fundal Height: 27 cm Movement: Present     General:  Alert, oriented and cooperative. Patient is in no acute distress.  Skin: Skin is warm and dry. No rash noted.   Cardiovascular: Normal heart rate noted  Respiratory: Normal respiratory effort, no problems with respiration noted  Abdomen: Soft, gravid, appropriate for gestational age.  Pain/Pressure: Absent     Pelvic: Cervical exam deferred        Extremities: Normal range of motion.  Edema: None  Mental Status:  Normal mood and affect. Normal behavior. Normal judgment and thought content.   Assessment and Plan:  Pregnancy: G1P0 at 214w0d  1. Chlamydia infection affecting pregnancy in third trimester TOC on 10/24- negative, repeat GC/C around 36 weeks with GBS testing  2. Supervision of normal first pregnancy, antepartum -Feels well, no complaints -Discussed length of this visit with her staying for the full 2 hrs  -Discussed negative results of STD testing that was requested by patient at last visit, Hep B reordered today due to wrong test being ordered previously - Glucose  Tolerance, 2 Hours w/1 Hour - CBC - Hepatitis B surface antigen   Preterm labor symptoms and general obstetric precautions including but not limited to vaginal bleeding, contractions, leaking of fluid and fetal movement were reviewed in detail with the patient. Please refer to After Visit Summary for other counseling recommendations.  Return in about 4 weeks (around 01/10/2018) for ROB.    Sharyon CableVeronica C Yazmyne Sara, CNM

## 2017-12-13 NOTE — Patient Instructions (Signed)

## 2017-12-14 LAB — CBC
Hematocrit: 30.1 % — ABNORMAL LOW (ref 34.0–46.6)
Hemoglobin: 9.9 g/dL — ABNORMAL LOW (ref 11.1–15.9)
MCH: 28.2 pg (ref 26.6–33.0)
MCHC: 32.9 g/dL (ref 31.5–35.7)
MCV: 86 fL (ref 79–97)
Platelets: 262 10*3/uL (ref 150–379)
RBC: 3.51 x10E6/uL — ABNORMAL LOW (ref 3.77–5.28)
RDW: 15.2 % (ref 12.3–15.4)
WBC: 7.3 10*3/uL (ref 3.4–10.8)

## 2017-12-14 LAB — GLUCOSE TOLERANCE, 2 HOURS W/ 1HR
Glucose, 1 hour: 89 mg/dL (ref 65–179)
Glucose, 2 hour: 100 mg/dL (ref 65–152)
Glucose, Fasting: 82 mg/dL (ref 65–91)

## 2017-12-14 LAB — HEPATITIS B SURFACE ANTIGEN: Hepatitis B Surface Ag: NEGATIVE

## 2017-12-27 IMAGING — US US MFM OB COMP +14 WKS
1 series · 14 of 28 positions shown · non-contrast
Comparison: none

[Series 1: us mfm ob comp +14 wks · 14 of 107 slices shown]
[im 4/107]
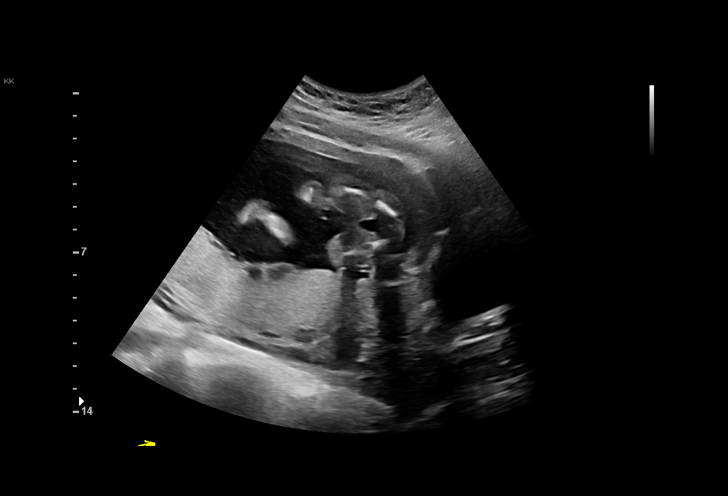
[im 12/107]
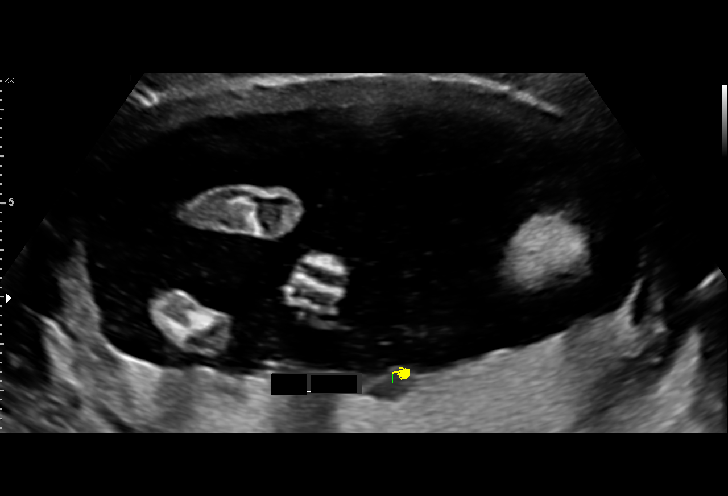
[im 20/107]
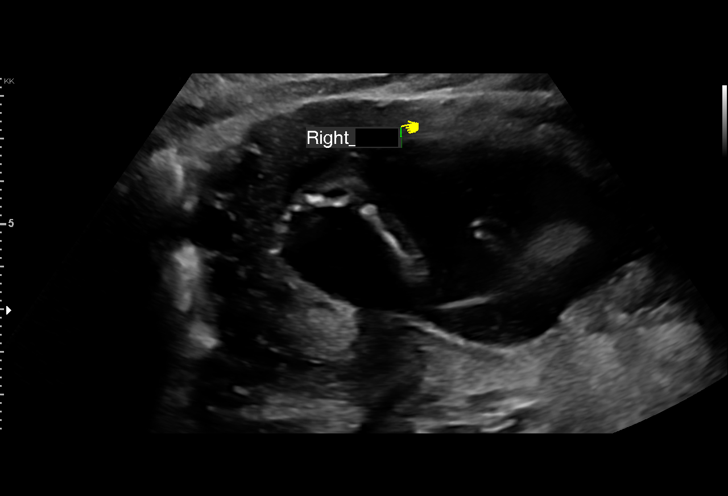
[im 28/107]
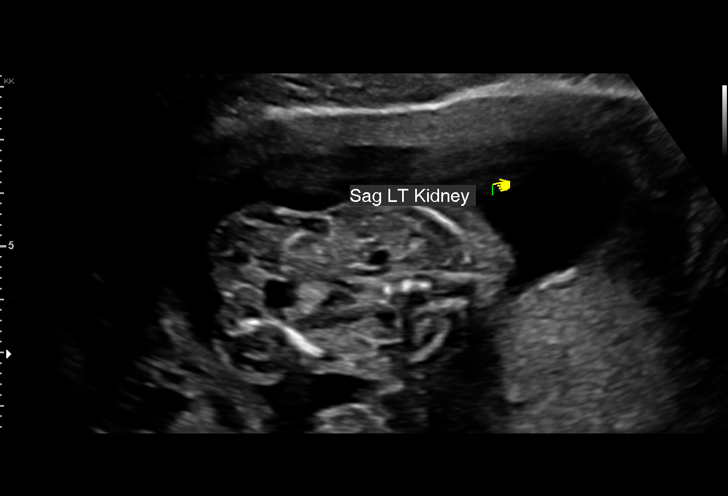
[im 36/107]
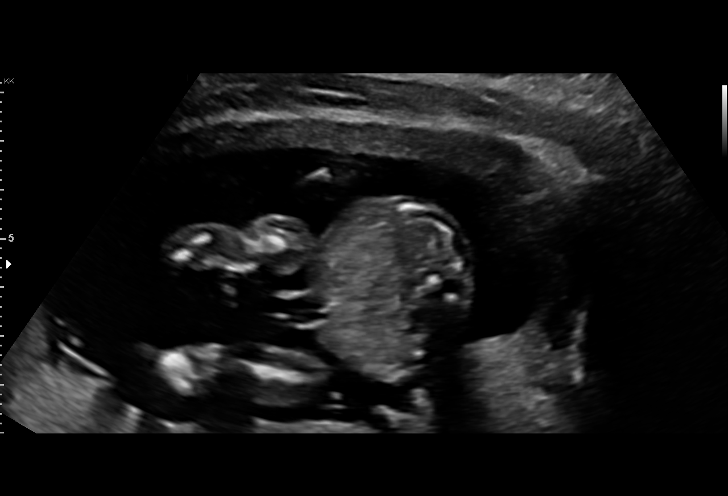
[im 44/107]
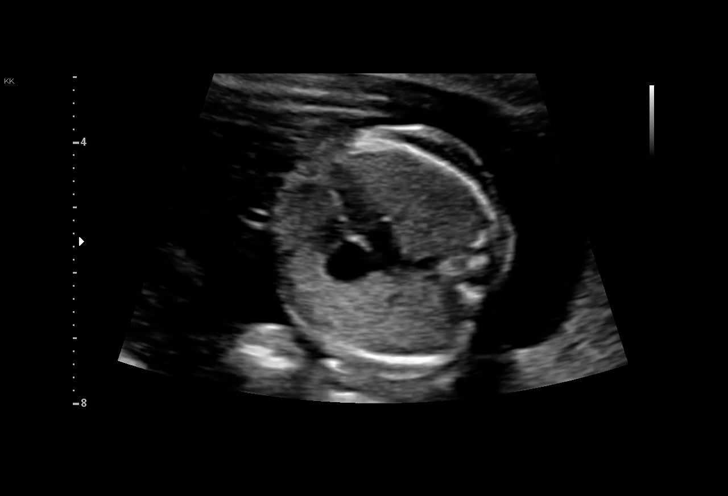
[im 52/107]
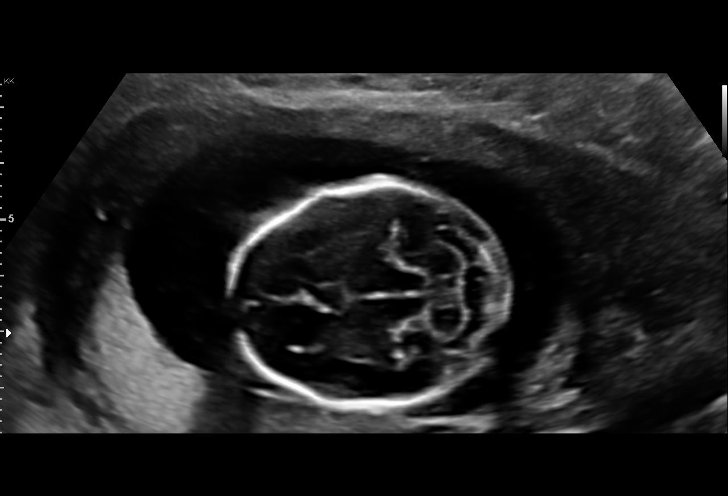
[im 59/107]
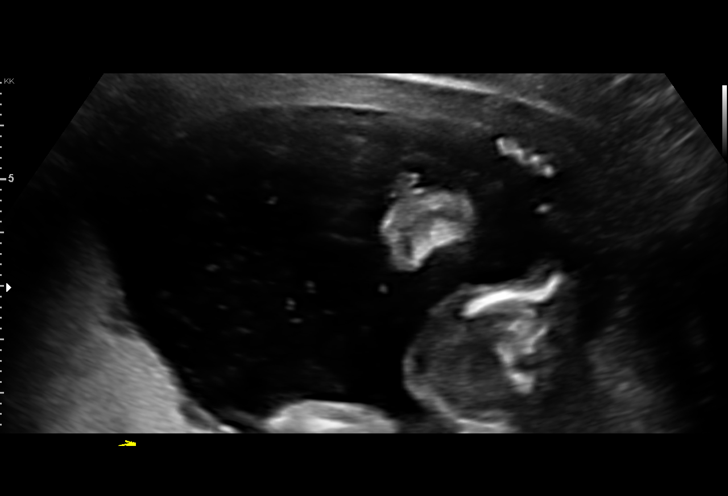
[im 67/107]
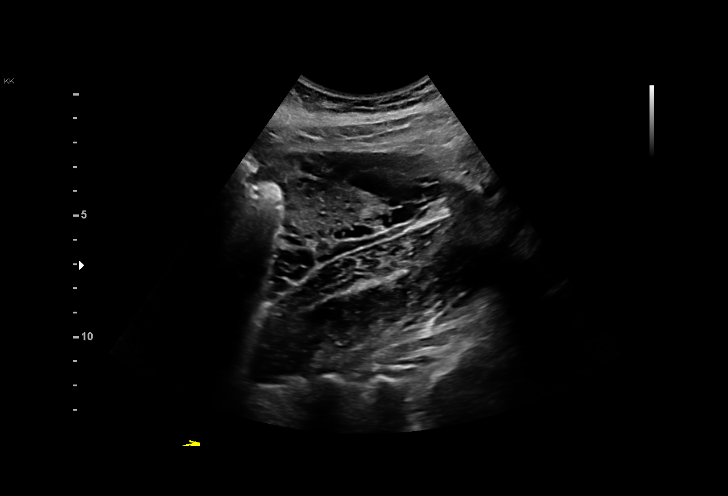
[im 75/107]
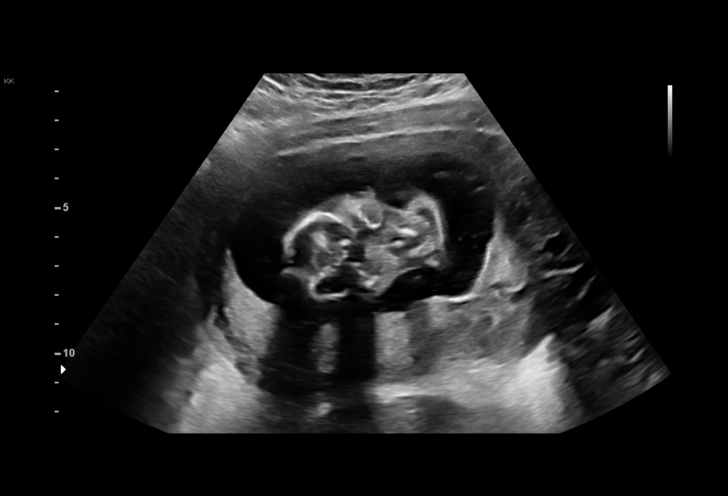
[im 83/107]
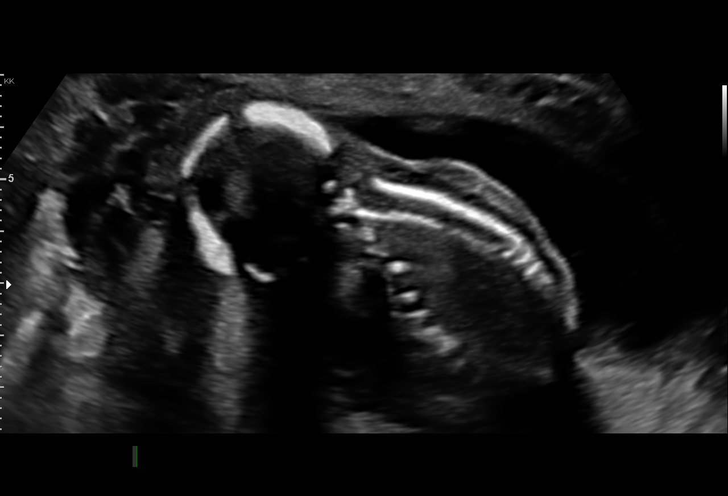
[im 91/107]
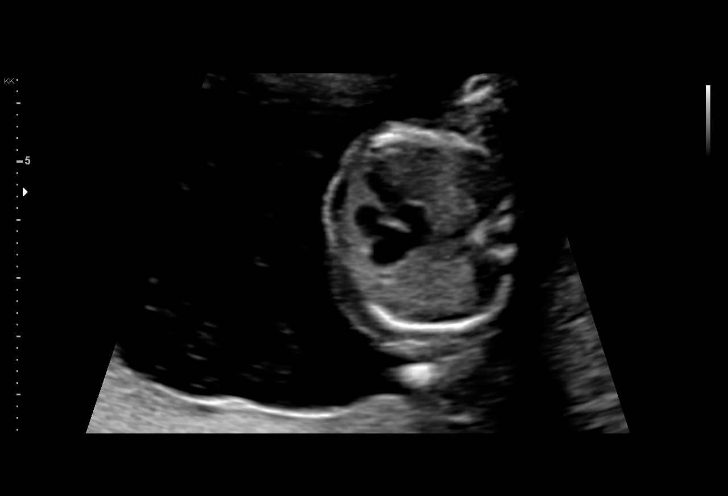
[im 99/107]
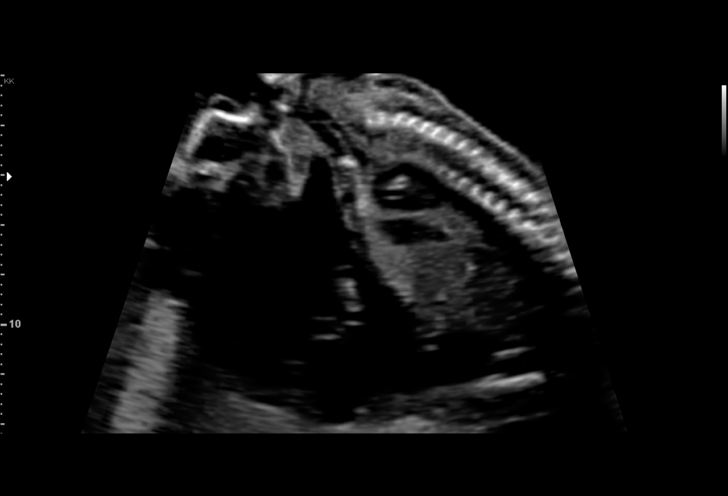
[im 107/107]
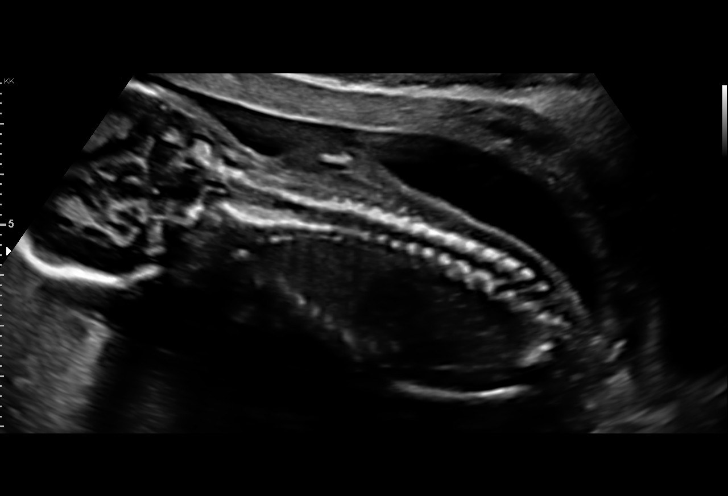

[14 of 28 positions shown; findings below may reference images not displayed]

1  DEEQA RAYAAN ADLAHO           [id number]       [id number]     [id number]
Indications

18 weeks gestation of pregnancy
Encounter for antenatal screening for
malformations
Teen pregnancy
Obesity complicating pregnancy, second
trimester
OB History

Blood Type:            Height:  5'3"   Weight (lb):  210       BMI:
Gravidity:    1
Fetal Evaluation

Num Of Fetuses:     1
Fetal Heart         142
Rate(bpm):
Cardiac Activity:   Observed
Presentation:       Variable
Placenta:           Posterior, Persistent LUS contraction
P. Cord Insertion:  Visualized

Amniotic Fluid
AFI FV:      Subjectively within normal limits

Largest Pocket(cm)
5.9
Biometry

BPD:        39  mm     G. Age:  17w 6d         44  %    CI:        70.81   %    70 - 86
FL/HC:      17.9   %    15.8 - 18
HC:      147.7  mm     G. Age:  17w 6d         37  %    HC/AC:      1.18        1.07 -
AC:      125.1  mm     G. Age:  18w 1d         52  %    FL/BPD:     67.7   %
FL:       26.4  mm     G. Age:  18w 0d         46  %    FL/AC:      21.1   %    20 - 24
NFT:       2.6  mm

Est. FW:     222  gm      0 lb 8 oz     51  %
Gestational Age

LMP:           18w 0d        Date:  05/31/17                 EDD:   03/07/18
U/S Today:     18w 0d                                        EDD:   03/07/18
Best:          18w 0d     Det. By:  LMP  (05/31/17)          EDD:   03/07/18
Anatomy

Cranium:               Appears normal         Aortic Arch:            Appears normal
Cavum:                 Appears normal         Ductal Arch:            Appears normal
Ventricles:            Appears normal         Diaphragm:              Appears normal
Choroid Plexus:        Appears normal         Stomach:                Appears normal, left
sided
Cerebellum:            Appears normal         Abdomen:                Appears normal
Posterior Fossa:       Appears normal         Abdominal Wall:         Appears nml (cord
insert, abd wall)
Nuchal Fold:           Appears normal         Cord Vessels:           Appears normal (3
vessel cord)
Face:                  Not well visualized    Kidneys:                Appear normal
Lips:                  Appears normal         Bladder:                Appears normal
Thoracic:              Appears normal         Spine:                  Not well visualized
Heart:                 Not well visualized    Upper Extremities:      Appears normal
RVOT:                  Appears normal         Lower Extremities:      Appears normal
LVOT:                  Appears normal

Other:  Parents do not wish to know sex of fetus. Female gender. Heels
visualized. Technically difficult due to maternal habitus and fetal
position.
Cervix Uterus Adnexa

Cervix
Length:            3.4  cm.
Normal appearance by transabdominal scan.

Adnexa:       No abnormality visualized.
Impression

Single living intrauterine pregnancy at 18w 0d.
Placenta Posterior, unable to exclude previa due to persistent
LUS contraction.
Appropriate fetal growth.
Normal amniotic fluid volume.
The face, four-chamber heart, and spine are not well
visualized.
The fetal anatomic survey is otherwise complete.
Normal fetal anatomy.
No fetal anomalies or soft markers of aneuploidy seen.
Recommendations

Recommend follow-up ultrasound examination in 4 weeks for
reassessment of placenta, fetal growth and anatomy.

## 2018-01-10 ENCOUNTER — Ambulatory Visit (INDEPENDENT_AMBULATORY_CARE_PROVIDER_SITE_OTHER): Payer: Medicaid Other | Admitting: Student

## 2018-01-10 DIAGNOSIS — N949 Unspecified condition associated with female genital organs and menstrual cycle: Secondary | ICD-10-CM

## 2018-01-10 DIAGNOSIS — Z3400 Encounter for supervision of normal first pregnancy, unspecified trimester: Secondary | ICD-10-CM

## 2018-01-10 NOTE — Progress Notes (Signed)
   PRENATAL VISIT NOTE  Subjective:  Angelica Cruz is a 22 y.o. G1P0 at 3925w0d being seen today for ongoing prenatal care.  She is currently monitored for the following issues for this low-risk pregnancy and has Supervision of normal first pregnancy, antepartum; Chlamydia infection affecting pregnancy; and Round ligament pain on their problem list.  Patient reports round ligament pain. .  Contractions: Not present. Vag. Bleeding: None.  Movement: Present. Denies leaking of fluid.   The following portions of the patient's history were reviewed and updated as appropriate: allergies, current medications, past family history, past medical history, past social history, past surgical history and problem list. Problem list updated.  Objective:   Vitals:   01/10/18 1051  BP: 102/64  Pulse: 94  Weight: 215 lb 4.8 oz (97.7 kg)    Fetal Status: Fetal Heart Rate (bpm): 146 Fundal Height: 31 cm Movement: Present     General:  Alert, oriented and cooperative. Patient is in no acute distress.  Skin: Skin is warm and dry. No rash noted.   Cardiovascular: Normal heart rate noted  Respiratory: Normal respiratory effort, no problems with respiration noted  Abdomen: Soft, gravid, appropriate for gestational age.  Pain/Pressure: Absent     Pelvic: Cervical exam deferred        Extremities: Normal range of motion.  Edema: None  Mental Status:  Normal mood and affect. Normal behavior. Normal judgment and thought content.   Assessment and Plan:  Pregnancy: G1P0 at 4225w0d  1. Supervision of normal first pregnancy, antepartum Doing well, unsure about BC.   2. Round ligament pain Info give, maternity support belt recommended.   Preterm labor symptoms and general obstetric precautions including but not limited to vaginal bleeding, contractions, leaking of fluid and fetal movement were reviewed in detail with the patient. Please refer to After Visit Summary for other counseling recommendations.    Return in about 4 weeks (around 02/07/2018), or LROB.   Marylene LandKathryn Lorraine Jetaun Colbath, CNM

## 2018-01-10 NOTE — Patient Instructions (Signed)

## 2018-01-23 IMAGING — US US MFM OB FOLLOW-UP
1 series · 14 of 28 positions shown · non-contrast
Comparison: none

[Series 1: us mfm ob follow-up · 65 acquisitions, 14 frames shown]
[im 3/65]
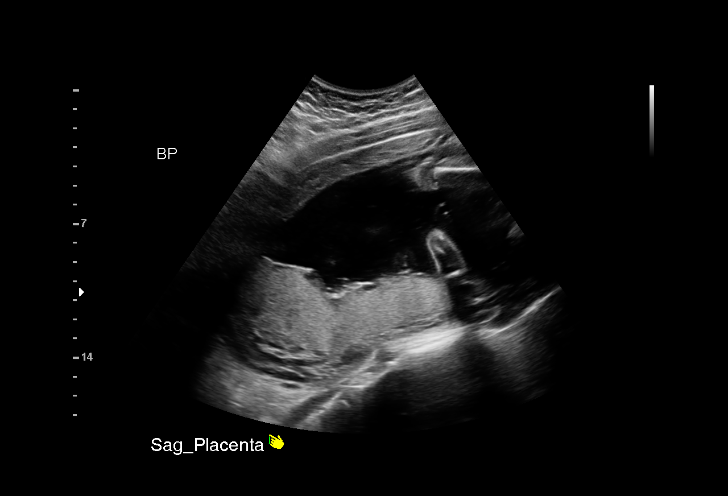
[im 8/65]
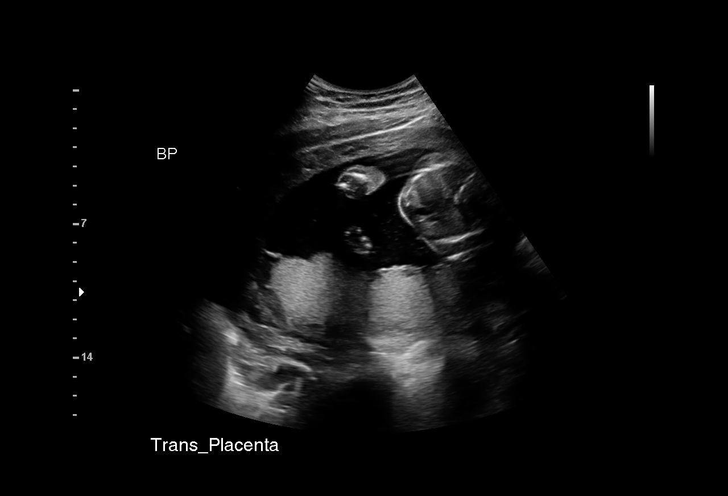
[im 12/65]
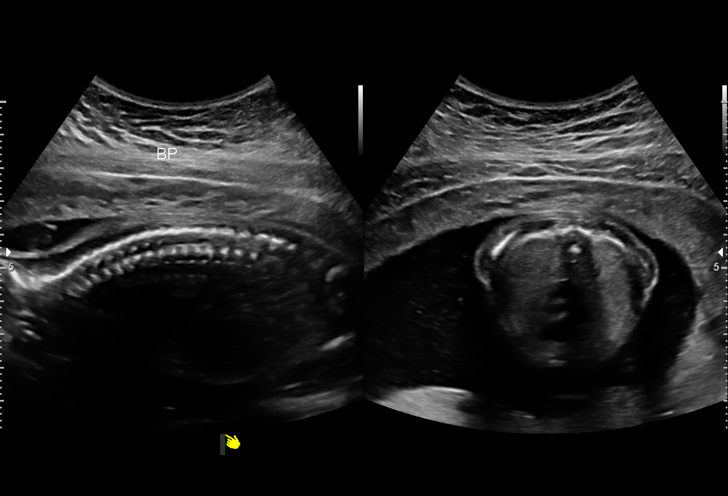
[im 17/65]
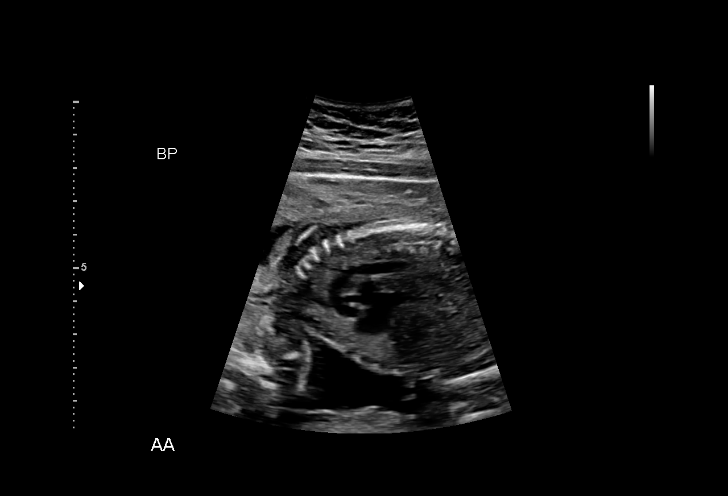
[im 22/65]
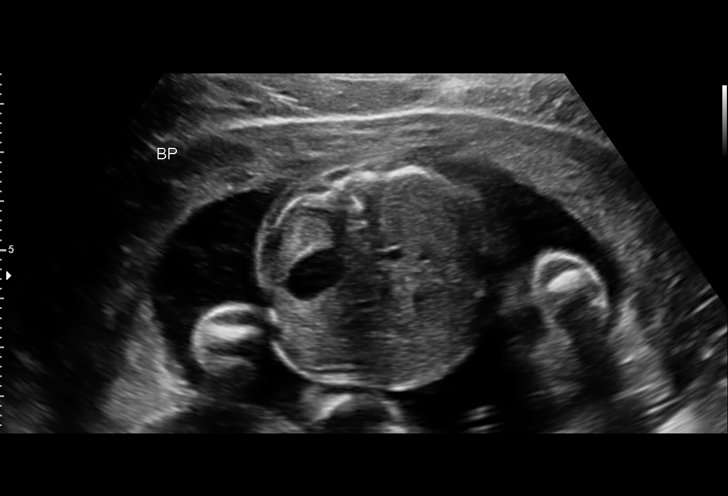
[im 27/65]
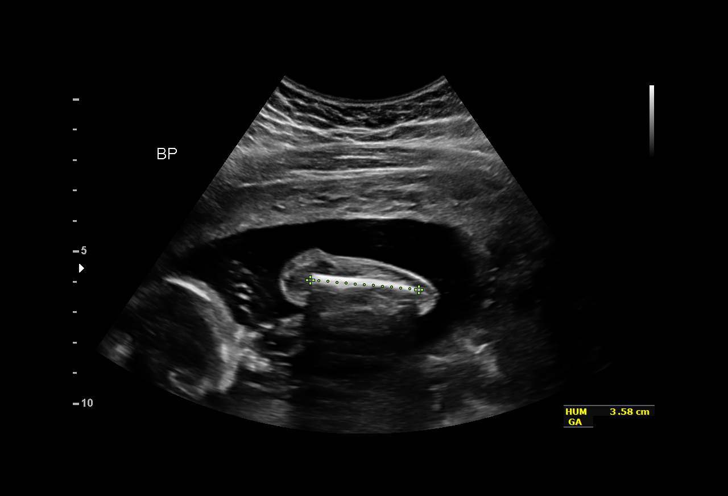
[im 31/65]
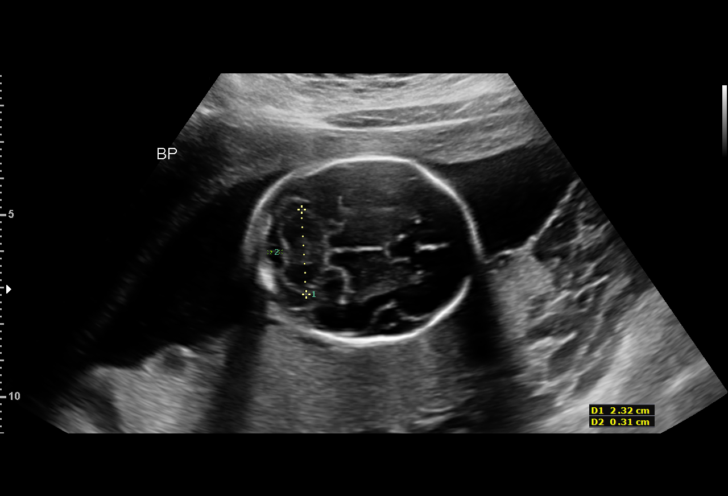
[im 36/65]
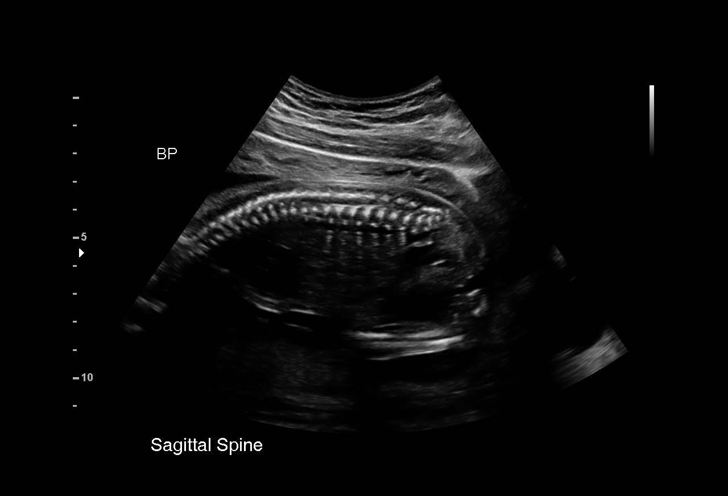
[im 41/65]
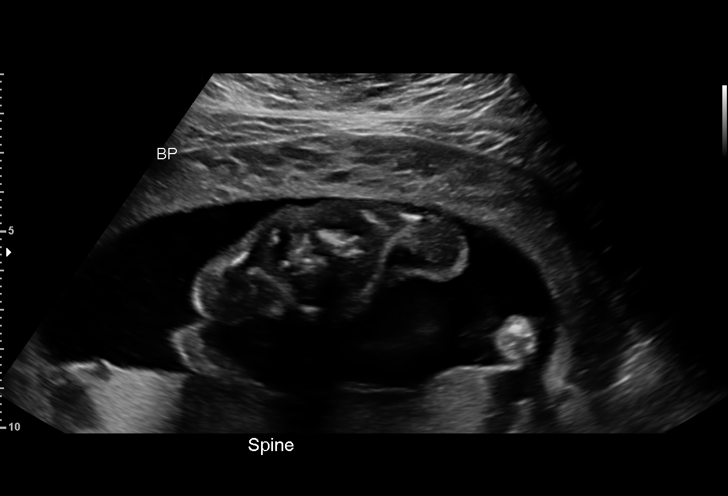
[im 46/65]
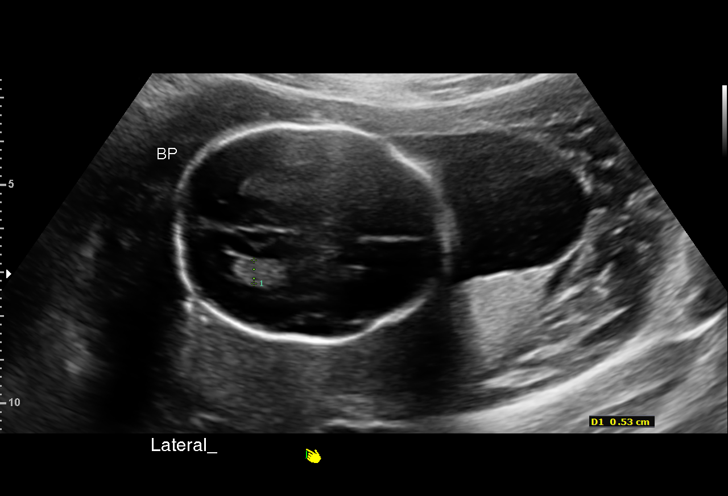
[im 50/65]
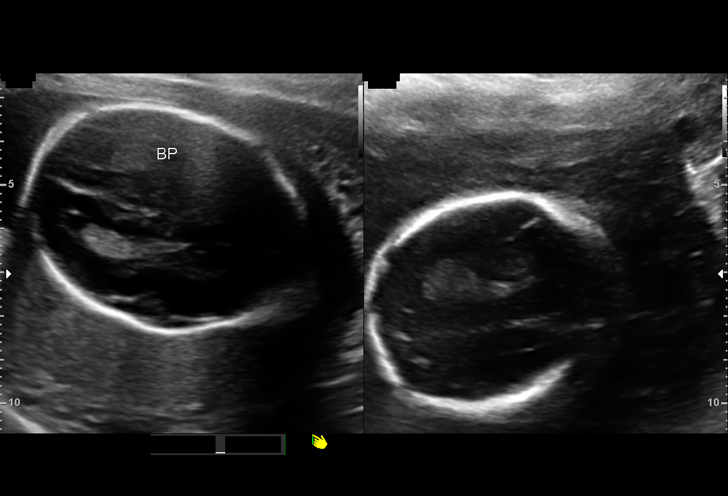
[im 55/65]
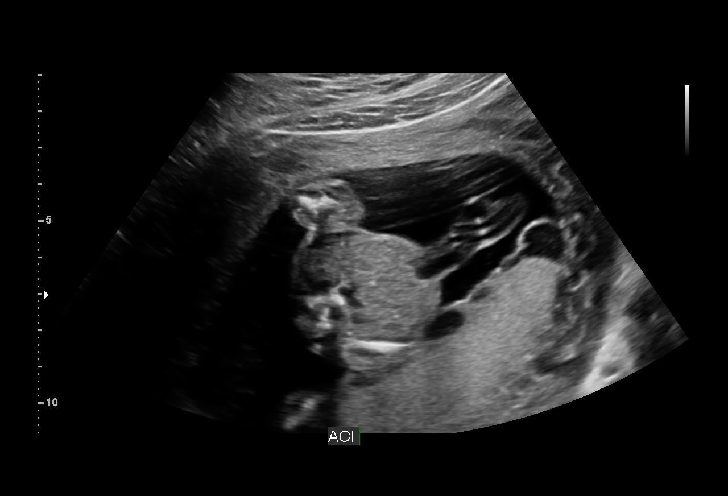
[im 60/65]
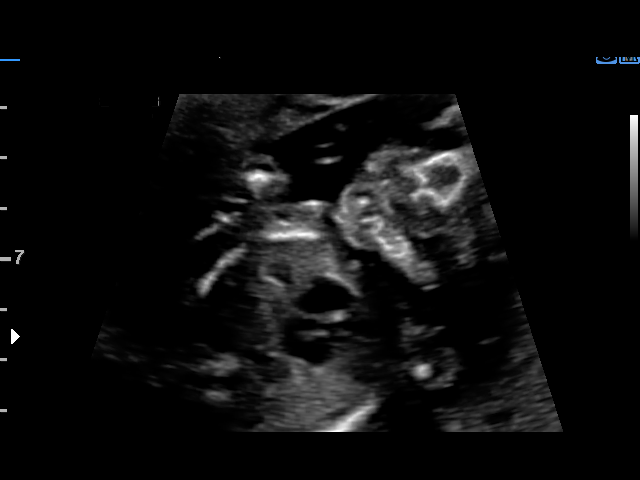
[im 65/65]
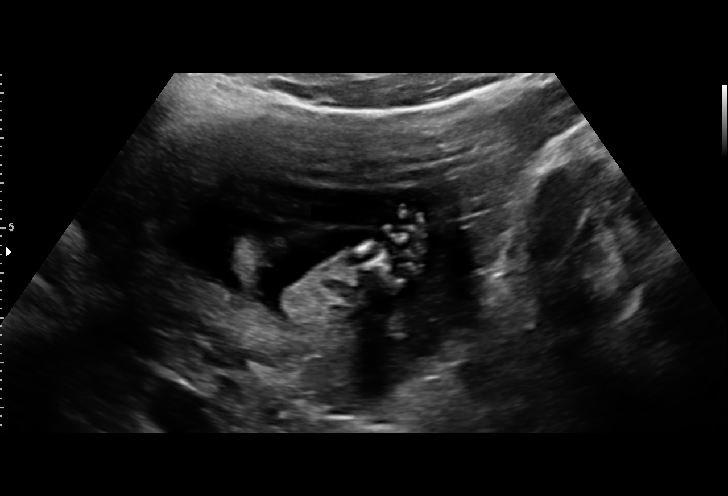

[14 of 28 positions shown; findings below may reference images not displayed]

1  HAJI MURTAZA HANE           [id number]       [id number]     [id number]
Indications

21 weeks gestation of pregnancy
Obesity complicating pregnancy, second
trimester
Evaluate anatomy not seen on prior
sonogram
OB History

Blood Type:            Height:  5'3"   Weight (lb):  210       BMI:
Gravidity:    1
Fetal Evaluation

Num Of Fetuses:     1
Fetal Heart         130
Rate(bpm):
Cardiac Activity:   Observed
Presentation:       Breech
Placenta:           Posterior, above cervical os
P. Cord Insertion:  Visualized

Amniotic Fluid
AFI FV:      Subjectively within normal limits

Largest Pocket(cm)
4.49
Gestational Age

LMP:           21w 6d        Date:  05/31/17                 EDD:   03/07/18
Best:          21w 6d     Det. By:  LMP  (05/31/17)          EDD:   03/07/18
Anatomy

Cranium:               Appears normal         Aortic Arch:            Appears normal
Cavum:                 Appears normal         Ductal Arch:            Previously seen
Ventricles:            Appears normal         Diaphragm:              Appears normal
Choroid Plexus:        Appears normal         Stomach:                Appears normal, left
sided
Cerebellum:            Appears normal         Abdomen:                Appears normal
Posterior Fossa:       Appears normal         Abdominal Wall:         Appears nml (cord
insert, abd wall)
Nuchal Fold:           Previously seen        Cord Vessels:           Appears normal (3
vessel cord)
Face:                  Appears normal         Kidneys:                Appear normal
(orbits and profile)
Lips:                  Appears normal         Bladder:                Appears normal
Thoracic:              Appears normal         Spine:                  Appears normal
Heart:                 Not well visualized    Upper Extremities:      Previously seen
RVOT:                  Previously seen        Lower Extremities:      Previously seen
LVOT:                  Appears normal

Other:  Female gender previously seen.  Heels previously seen. Technically
difficult due to fetal position.
Cervix Uterus Adnexa

Cervix
Length:            4.3  cm.
Normal appearance by transabdominal scan.

Uterus
No abnormality visualized.

Left Ovary
Not visualized.

Right Ovary
Not visualized.

Cul De Sac:   No free fluid seen.

Adnexa:       No abnormality visualized.
Impression

SIUP at 92w8d
active fetus
no defects seen
limited views as above
no previa
cervix is long and closed.
Recommendations

Recommmend follow up attempt to complete survey and plot
interval growth in 4-6 weeks (please call our unit to schedule;
remote read only).

## 2018-01-24 ENCOUNTER — Ambulatory Visit (INDEPENDENT_AMBULATORY_CARE_PROVIDER_SITE_OTHER): Payer: Medicaid Other | Admitting: Student

## 2018-01-24 VITALS — BP 109/67 | HR 103 | Wt 218.1 lb

## 2018-01-24 DIAGNOSIS — Z3400 Encounter for supervision of normal first pregnancy, unspecified trimester: Secondary | ICD-10-CM

## 2018-01-24 NOTE — Progress Notes (Signed)
   PRENATAL VISIT NOTE  Subjective:  Angelica Cruz is a 22 y.o. G1P0 at 2235w0d being seen today for ongoing prenatal care.  She is currently monitored for the following issues for this low-risk pregnancy and has Supervision of normal first pregnancy, antepartum; Chlamydia infection affecting pregnancy; and Round ligament pain on their problem list.  Patient reports no complaints.  Contractions: Not present. Vag. Bleeding: None.  Movement: Absent. Denies leaking of fluid.   The following portions of the patient's history were reviewed and updated as appropriate: allergies, current medications, past family history, past medical history, past social history, past surgical history and problem list. Problem list updated.  Objective:   Vitals:   01/24/18 1034  BP: 109/67  Pulse: (!) 103  Weight: 218 lb 1.6 oz (98.9 kg)    Fetal Status: Fetal Heart Rate (bpm): 138 Fundal Height: 34 cm Movement: Absent     General:  Alert, oriented and cooperative. Patient is in no acute distress.  Skin: Skin is warm and dry. No rash noted.   Cardiovascular: Normal heart rate noted  Respiratory: Normal respiratory effort, no problems with respiration noted  Abdomen: Soft, gravid, appropriate for gestational age.  Pain/Pressure: Absent     Pelvic: Cervical exam deferred        Extremities: Normal range of motion.  Edema: None  Mental Status:  Normal mood and affect. Normal behavior. Normal judgment and thought content.   Assessment and Plan:  Pregnancy: G1P0 at 2935w0d  1. Supervision of normal first pregnancy, antepartum DOing well, no complaints.  Reviewed next visit (GBS, vag exam) Discussed birth control, patient considering IUD.    Preterm labor symptoms and general obstetric precautions including but not limited to vaginal bleeding, contractions, leaking of fluid and fetal movement were reviewed in detail with the patient. Please refer to After Visit Summary for other counseling  recommendations.  Return in about 2 weeks (around 02/07/2018), or LROB.   Marylene LandKathryn Lorraine Kooistra, CNM

## 2018-01-24 NOTE — Patient Instructions (Addendum)

## 2018-01-30 ENCOUNTER — Encounter: Payer: Self-pay | Admitting: *Deleted

## 2018-02-07 ENCOUNTER — Other Ambulatory Visit (HOSPITAL_COMMUNITY)
Admission: RE | Admit: 2018-02-07 | Discharge: 2018-02-07 | Disposition: A | Discharge status: Home / Self Care | Payer: Medicaid Other | Source: Ambulatory Visit | Attending: Family Medicine | Admitting: Family Medicine

## 2018-02-07 ENCOUNTER — Ambulatory Visit (INDEPENDENT_AMBULATORY_CARE_PROVIDER_SITE_OTHER): Payer: Medicaid Other | Admitting: Student

## 2018-02-07 VITALS — BP 113/71 | HR 111 | Wt 223.2 lb

## 2018-02-07 DIAGNOSIS — A749 Chlamydial infection, unspecified: Secondary | ICD-10-CM

## 2018-02-07 DIAGNOSIS — Z3400 Encounter for supervision of normal first pregnancy, unspecified trimester: Secondary | ICD-10-CM

## 2018-02-07 DIAGNOSIS — O98813 Other maternal infectious and parasitic diseases complicating pregnancy, third trimester: Secondary | ICD-10-CM

## 2018-02-07 NOTE — Progress Notes (Signed)
   PRENATAL VISIT NOTE  Subjective:  Angelica Cruz is a 22 y.o. G1P0 at 1768w0d being seen today for ongoing prenatal care.  She is currently monitored for the following issues for this low-risk pregnancy and has Supervision of normal first pregnancy, antepartum; Chlamydia infection affecting pregnancy; and Round ligament pain on their problem list.  Patient reports backache.  Contractions: Not present. Vag. Bleeding: None.  Movement: Present. Denies leaking of fluid.   The following portions of the patient's history were reviewed and updated as appropriate: allergies, current medications, past family history, past medical history, past social history, past surgical history and problem list. Problem list updated.  Objective:   Vitals:   02/07/18 1057  BP: 113/71  Pulse: (!) 111  Weight: 223 lb 3.2 oz (101.2 kg)    Fetal Status: Fetal Heart Rate (bpm): 161 Fundal Height: 37 cm Movement: Present  Presentation: Vertex  General:  Alert, oriented and cooperative. Patient is in no acute distress.  Skin: Skin is warm and dry. No rash noted.   Cardiovascular: Normal heart rate noted  Respiratory: Normal respiratory effort, no problems with respiration noted  Abdomen: Soft, gravid, appropriate for gestational age.  Pain/Pressure: Present     Pelvic: Cervical exam performed Dilation: Closed      Extremities: Normal range of motion.  Edema: None  Mental Status:  Normal mood and affect. Normal behavior. Normal judgment and thought content.   Assessment and Plan:  Pregnancy: G1P0 at 1968w0d  1. Supervision of normal first pregnancy, antepartum -Patient doing well; no complaints. [facility]  - GC[facility]Chlamydia probe amp ([place] - Culture, beta strep (group b only)  2. Chlamydia infection affecting pregnancy in third trimester Repeat cultures today; no signs and symptoms.   Term labor symptoms and general obstetric precautions including but not limited to  vaginal bleeding, contractions, leaking of fluid and fetal movement were reviewed in detail with the patient. Please refer to After Visit Summary for other counseling recommendations.  Return in about 1 week (around 02/14/2018).   Marylene LandKathryn Lorraine Kaymarie Cruz, CNM

## 2018-02-07 NOTE — Patient Instructions (Signed)
Group B Streptococcus Infection During Pregnancy Group B Streptococcus (GBS) is a type of bacteria (Streptococcus agalactiae) that is often found in healthy people, commonly in the rectum, vagina, and intestines. In people who are healthy and not pregnant, the bacteria rarely cause serious illness or complications. However, women who test positive for GBS during pregnancy can pass the bacteria to their baby during childbirth, which can cause serious infection in the baby after birth. Women with GBS may also have infections during their pregnancy or immediately after childbirth, such as such as urinary tract infections (UTIs) or infections of the uterus (uterine infections). Having GBS also increases a woman's risk of complications during pregnancy, such as early (preterm) labor or delivery, miscarriage, or stillbirth. Routine testing (screening) for GBS is recommended for all pregnant women. What increases the risk? You may have a higher risk for GBS infection during pregnancy if you had one during a past pregnancy. What are the signs or symptoms? In most cases, GBS infection does not cause symptoms in pregnant women. Signs and symptoms of a possible GBS-related infection may include:  Labor starting before the 37th week of pregnancy.  A UTI or bladder infection, which may cause: ? Fever. ? Pain or burning during urination. ? Frequent urination.  Fever during labor, along with: ? Bad-smelling discharge. ? Uterine tenderness. ? Rapid heartbeat in the mother, baby, or both.  Rare but serious symptoms of a possible GBS-related infection in women include:  Blood infection (septicemia). This may cause fever, chills, or confusion.  Lung infection (pneumonia). This may cause fever, chills, cough, rapid breathing, difficulty breathing, or chest pain.  Bone, joint, skin, or soft tissue infection.  How is this diagnosed? You may be screened for GBS between week 35 and week 37 of your pregnancy. If  you have symptoms of preterm labor, you may be screened earlier. This condition is diagnosed based on lab test results from:  A swab of fluid from the vagina and rectum.  A urine sample.  How is this treated? This condition is treated with antibiotic medicine. When you go into labor, or as soon as your water breaks (your membranes rupture), you will be given antibiotics through an IV tube. Antibiotics will continue until after you give birth. If you are having a cesarean delivery, you do not need antibiotics unless your membranes have already ruptured. Follow these instructions at home:  Take over-the-counter and prescription medicines only as told by your health care provider.  Take your antibiotic medicine as told by your health care provider. Do not stop taking the antibiotic even if you start to feel better.  Keep all pre-birth (prenatal) visits and follow-up visits as told by your health care provider. This is important. Contact a health care provider if:  You have pain or burning when you urinate.  You have to urinate frequently.  You have a fever or chills.  You develop a bad-smelling vaginal discharge. Get help right away if:  Your membranes rupture.  You go into labor.  You have severe pain in your abdomen.  You have difficulty breathing.  You have chest pain. This information is not intended to replace advice given to you by your health care provider. Make sure you discuss any questions you have with your health care provider. Document Released: 02/27/2008 Document Revised: 06/16/2016 Document Reviewed: 06/15/2016 Elsevier Interactive Patient Education  2018 [employer]  

## 2018-02-09 ENCOUNTER — Other Ambulatory Visit: Payer: Self-pay | Admitting: Student

## 2018-02-09 ENCOUNTER — Telehealth: Payer: Self-pay | Admitting: Student

## 2018-02-09 DIAGNOSIS — A749 Chlamydial infection, unspecified: Secondary | ICD-10-CM

## 2018-02-09 DIAGNOSIS — O98813 Other maternal infectious and parasitic diseases complicating pregnancy, third trimester: Principal | ICD-10-CM

## 2018-02-09 LAB — GC/CHLAMYDIA PROBE AMP (~~LOC~~) NOT AT ARMC
CHLAMYDIA, DNA PROBE: POSITIVE — AB
NEISSERIA GONORRHEA: NEGATIVE

## 2018-02-09 MED ORDER — AZITHROMYCIN 250 MG PO TABS: 1000.0000 mg | ORAL_TABLET | Freq: Once | ORAL | 0 refills | Status: AC

## 2018-02-09 NOTE — Telephone Encounter (Signed)
Patient called back to [facility]; I explained the diagnosis. She will pick of medication today. She will talk to her partner and have him seek testing and [facility]. I emphasized importance of treatment ASAP for both people and to abstain from intercourse until two weeks after both people have been treated.

## 2018-02-09 NOTE — Telephone Encounter (Signed)
Left message with patient saying that I needed to talk to her about results and that RX was waiting for her at pharmacy. Gave her the number for [facility] back-up phone and asked her to call me here before 8 pm. If she cannot, the clinic will call her next week about her results.  Luna KitchensKathryn Cranford Blessinger CNM

## 2018-02-10 LAB — CULTURE, BETA STREP (GROUP B ONLY): STREP GP B CULTURE: POSITIVE — AB

## 2018-02-11 NOTE — Telephone Encounter (Signed)
[facility]

## 2018-02-14 ENCOUNTER — Ambulatory Visit (INDEPENDENT_AMBULATORY_CARE_PROVIDER_SITE_OTHER): Payer: Medicaid Other | Admitting: Student

## 2018-02-14 VITALS — BP 104/73 | HR 119 | Wt 225.9 lb

## 2018-02-14 DIAGNOSIS — B951 Streptococcus, group B, as the cause of diseases classified elsewhere: Secondary | ICD-10-CM | POA: Insufficient documentation

## 2018-02-14 DIAGNOSIS — Z3400 Encounter for supervision of normal first pregnancy, unspecified trimester: Secondary | ICD-10-CM

## 2018-02-14 DIAGNOSIS — O98813 Other maternal infectious and parasitic diseases complicating pregnancy, third trimester: Secondary | ICD-10-CM

## 2018-02-14 DIAGNOSIS — A749 Chlamydial infection, unspecified: Secondary | ICD-10-CM

## 2018-02-14 DIAGNOSIS — O98819 Other maternal infectious and parasitic diseases complicating pregnancy, unspecified trimester: Secondary | ICD-10-CM

## 2018-02-14 NOTE — Patient Instructions (Signed)
Chlamydia, Female Chlamydia is an STD (sexually transmitted disease). This is an infection that spreads through sexual contact. If it is not treated, it can cause serious problems. It must be treated with antibiotic medicine. Sometimes, you may not have symptoms (asymptomatic). When you have symptoms, they can include:  Burning when you pee (urinate).  Peeing often.  Fluid (discharge) coming from the vagina.  Redness, soreness, and swelling (inflammation) of the butt (rectum).  Bleeding or fluid coming from the butt.  Belly (abdominal) pain.  Pain during sex.  Bleeding between periods.  Itching, burning, or redness in the eyes.  Fluid coming from the eyes.  Follow these instructions at home: Medicines  Take over-the-counter and prescription medicines only as told by your doctor.  Take your antibiotic medicine as told by your doctor. Do not stop taking the antibiotic even if you start to feel better. Sexual activity  Tell sex partners about your infection. Sex partners are people you had oral, anal, or vaginal sex with within 60 days of when you started getting sick. They need treatment, too.  Do not have sex until: ? You and your sex partners have been treated. ? Your doctor says it is okay.  If you have a single dose treatment, wait 7 days before having sex. General instructions  It is up to you to get your test results. Ask your doctor when your results will be ready.  Get a lot of rest.  Eat healthy foods.  Drink enough fluid to keep your pee (urine) clear or pale yellow.  Keep all follow-up visits as told by your doctor. You may need tests after 3 months. Preventing chlamydia  The only way to prevent chlamydia is not to have sex. To lower your risk: ? Use latex condoms correctly. Do this every time you have sex. ? Avoid having many sex partners. ? Ask if your partner has been tested for STDs and if he or she had negative results. Contact a doctor if:  You  get new symptoms.  You do not get better with treatment.  You have a fever or chills.  You have pain during sex. Get help right away if:  Your pain gets worse and does not get better with medicine.  You get flu-like symptoms, such as: ? Night sweats. ? Sore throat. ? Muscle aches.  You feel sick to your stomach (nauseous).  You throw up (vomit).  You have trouble swallowing.  You have bleeding: ? Between periods. ? After sex.  You have irregular periods.  You have belly pain that does not get better with medicine.  You have lower back pain that does not get better with medicine.  You feel weak or dizzy.  You pass out (faint).  You are pregnant and you get symptoms of chlamydia. Summary  Chlamydia is an infection that spreads through sexual contact.  Sometimes, chlamydia can cause no symptoms (asymptomatic).  Do not have sex until your doctor says it is okay.  All sex partners will have to be treated for chlamydia. This information is not intended to replace advice given to you by your health care provider. Make sure you discuss any questions you have with your health care provider. Document Released: 08/29/2008 Document Revised: 11/09/2016 Document Reviewed: 11/09/2016 Elsevier Interactive Patient Education  2017 [employer] Inc.  

## 2018-02-14 NOTE — Progress Notes (Signed)
   PRENATAL VISIT NOTE  Subjective:  Angelica Cruz is a 22 y.o. G1P0 at 6423w0d being seen today for ongoing prenatal care.  She is currently monitored for the following issues for this low-risk pregnancy and has Supervision of normal first pregnancy, antepartum; Chlamydia infection affecting pregnancy; Round ligament pain; and Group B streptococcal infection during pregnancy on their problem list.  Patient reports no complaints. Patient denies any S/S of chlamydia. She completed her treatment on 3/9, although she threw up 1 hour and 40 minutes after taking the pills.  Contractions: Not present. Vag. Bleeding: None.  Movement: Present. Denies leaking of fluid.   The following portions of the patient's history were reviewed and updated as appropriate: allergies, current medications, past family history, past medical history, past social history, past surgical history and problem list. Problem list updated.  Objective:   Vitals:   02/14/18 1004  BP: 104/73  Pulse: (!) 119  Weight: 225 lb 14.4 oz (102.5 kg)    Fetal Status: Fetal Heart Rate (bpm): 150 Fundal Height: 38 cm Movement: Present     General:  Alert, oriented and cooperative. Patient is in no acute distress.  Skin: Skin is warm and dry. No rash noted.   Cardiovascular: Normal heart rate noted  Respiratory: Normal respiratory effort, no problems with respiration noted  Abdomen: Soft, gravid, appropriate for gestational age.  Pain/Pressure: Present     Pelvic: Cervical exam deferred        Extremities: Normal range of motion.  Edema: None  Mental Status:  Normal mood and affect. Normal behavior. Normal judgment and thought content.   Assessment and Plan:  Pregnancy: G1P0 at 6823w0d  1. Chlamydia infection affecting pregnancy in third trimester -Patient took abx on 02/09/2018. Has not yet spoken to her partner, plans to tell him soon that he needs to be tested and treated. Is abstinent right now.  -TOC in 2 weeks   2.  Supervision of normal first pregnancy, antepartum -Routine care  3. Group B streptococcal infection during pregnancy -Advised patient of these results and plan for intrapartum prophylaxis.   Term labor symptoms and general obstetric precautions including but not limited to vaginal bleeding, contractions, leaking of fluid and fetal movement were reviewed in detail with the patient. Please refer to After Visit Summary for other counseling recommendations.  Return in about 1 week (around 02/21/2018).   Felicie MornHannah E Smith, Medical Student    Patient is a  CNM attestation:  I have seen and examined this patient and agree with above documentation in the students's note.   Angelica Cruz is a 22 y.o. G1P0 at 2223w0d here for prenatal care.  +FM, denies LOF, VB, contractions, vaginal discharge.  PE: Gen: calm comfortable, NAD Resp: normal effort, no distress Heart: Regular rate Abd: Soft, NT, gravid, S=D   ROS, labs, PMH reviewed   Assessment: 1. Supervision of normal first pregnancy, antepartum   2. Chlamydia infection affecting pregnancy in third trimester   3. Group B streptococcal infection during pregnancy     Plan: - Follow-up as scheduled  In two weeks for prenatal visit or sooner as needed if symptoms worsen. - Return to maternity admissions if warning signs of labor develop.    Marylene LandKooistra, Kathryn Lorraine, CNM 02/14/2018 1:11 PM

## 2018-02-21 ENCOUNTER — Ambulatory Visit (INDEPENDENT_AMBULATORY_CARE_PROVIDER_SITE_OTHER): Payer: Medicaid Other | Admitting: Student

## 2018-02-21 VITALS — BP 99/62 | HR 121 | Wt 226.0 lb

## 2018-02-21 DIAGNOSIS — B951 Streptococcus, group B, as the cause of diseases classified elsewhere: Secondary | ICD-10-CM

## 2018-02-21 DIAGNOSIS — Z3400 Encounter for supervision of normal first pregnancy, unspecified trimester: Secondary | ICD-10-CM

## 2018-02-21 DIAGNOSIS — A749 Chlamydial infection, unspecified: Secondary | ICD-10-CM

## 2018-02-21 DIAGNOSIS — O98819 Other maternal infectious and parasitic diseases complicating pregnancy, unspecified trimester: Secondary | ICD-10-CM

## 2018-02-21 DIAGNOSIS — O98813 Other maternal infectious and parasitic diseases complicating pregnancy, third trimester: Secondary | ICD-10-CM

## 2018-02-21 NOTE — Progress Notes (Signed)
   PRENATAL VISIT NOTE  Subjective:  Angelica Cruz is a 22 y.o. G1P0 at 1763w0d being seen today for ongoing prenatal care.  She is currently monitored for the following issues for this low-risk pregnancy and has Supervision of normal first pregnancy, antepartum; Chlamydia infection affecting pregnancy; Round ligament pain; and Group B streptococcal infection during pregnancy on their problem list.  Patient reports she has a cold. Has been resting at home. Also has some pressure and low back ache on her middle back. .  Contractions: Not present. Vag. Bleeding: None.  Movement: Present. Denies leaking of fluid.   The following portions of the patient's history were reviewed and updated as appropriate: allergies, current medications, past family history, past medical history, past social history, past surgical history and problem list. Problem list updated.  Objective:   Vitals:   02/21/18 0959  BP: 99/62  Pulse: (!) 121  Weight: 226 lb (102.5 kg)    Fetal Status: Fetal Heart Rate (bpm): 160 Fundal Height: 39 cm Movement: Present  Presentation: Vertex  General:  Alert, oriented and cooperative. Patient is in no acute distress.  Skin: Skin is warm and dry. No rash noted.   Cardiovascular: Normal heart rate noted  Respiratory: Normal respiratory effort, no problems with respiration noted  Abdomen: Soft, gravid, appropriate for gestational age.  Pain/Pressure: Present     Pelvic: Cervical exam performed Dilation: 1 Effacement (%): 20 Station: Ballotable  Extremities: Normal range of motion.  Edema: None  Mental Status:  Normal mood and affect. Normal behavior. Normal judgment and thought content.   Assessment and Plan:  Pregnancy: G1P0 at 3763w0d  1. Chlamydia infection affecting pregnancy in third trimester -partner is going to get tested; she will get a repeat TOC next week 2. Group B streptococcal infection during pregnancy  3. Supervision of normal first pregnancy,  antepartum Recommended safe meds for URIr, reviewed breastfeeding problem solving.    Term labor symptoms and general obstetric precautions including but not limited to vaginal bleeding, contractions, leaking of fluid and fetal movement were reviewed in detail with the patient. Please refer to After Visit Summary for other counseling recommendations.  Return in about 1 week (around 02/28/2018).   Marylene LandKathryn Lorraine Ashir Kunz, CNM

## 2018-02-21 NOTE — Patient Instructions (Signed)
Breastfeeding Challenges and Solutions  Even though breastfeeding is natural, it can be challenging, especially in the first few weeks after childbirth. It is normal for problems to arise when starting to breastfeed your new baby, even if you have breastfed before. This document provides some solutions to the most common breastfeeding challenges.  Challenges and solutions  Challenge--Cracked or Sore Nipples  Cracked or sore nipples are commonly experienced by breastfeeding mothers. Cracked or sore nipples often are caused by inadequate latching (when your baby's mouth attaches to your breast to breastfeed). Soreness can also happen if your baby is not positioned properly at your breast. Although nipple cracking and soreness are common during the first week after birth, nipple pain is never normal. If you experience nipple cracking or soreness that lasts longer than 1 week or nipple pain, call your health care provider or lactation consultant.  Solution  Ensure proper latching and positioning of your baby by following the steps below:  · Find a comfortable place to sit or lie down, with your neck and back well supported.  · Place a pillow or rolled up blanket under your baby to bring him or her to the level of your breast (if you are seated).  · Make sure that your baby's abdomen is facing your abdomen.  · Gently massage your breast. With your fingertips, massage from your chest wall toward your nipple in a circular motion. This encourages milk flow. You may need to continue this action during the feeding if your milk flows slowly.  · Support your breast with 4 fingers underneath and your thumb above your nipple. Make sure your fingers are well away from your nipple and your baby’s mouth.  · Stroke your baby's lips gently with your finger or nipple.  · When your baby's mouth is open wide enough, quickly bring your baby to your breast, placing your entire nipple and as much of the colored area around your nipple  (areola) as possible into your baby's mouth.  ? More areola should be visible above your baby's upper lip than below the lower lip.  ? Your baby's tongue should be between his or her lower gum and your breast.  · Ensure that your baby's mouth is correctly positioned around your nipple (latched). Your baby's lips should create a seal on your breast and be turned out (everted).  · It is common for your baby to suck for about 2-3 minutes in order to start the flow of breast milk.    Signs that your baby has successfully latched on to your nipple include:  · Quietly tugging or quietly sucking without causing you pain.  · Swallowing heard between every 3-4 sucks.  · Muscle movement above and in front of his or her ears with sucking.    Signs that your baby has not successfully latched on to nipple include:  · Sucking sounds or smacking sounds from your baby while nursing.  · Nipple pain.    Ensure that your breasts stay moisturized and healthy by:  · Avoiding the use of soap on your nipples.  · Wearing a supportive bra. Avoid wearing underwire-style bras or tight bras.  · Air drying your nipples for 3-4 minutes after each feeding.  · Using only cotton bra pads to absorb breast milk leakage. Leaking of breast milk between feedings is normal. Be sure to change the pads if they become soaked with milk.  · Using lanolin on your nipples after nursing. Lanolin helps to maintain your   skin's normal moisture barrier. If you use pure lanolin you do not need to wash it off before feeding your baby again. Pure lanolin is not toxic to your baby. You may also hand express a few drops of breast milk and gently massage that milk into your nipples, allowing it to air dry.    Challenge--Breast Engorgement  Breast engorgement is the overfilling of your breasts with breast milk. In the first few weeks after giving birth, you may experience breast engorgement. Breast engorgement can make your breasts throb and feel hard, tightly stretched,  warm, and tender. Engorgement peaks about the fifth day after you give birth. Having breast engorgement does not mean you have to stop breastfeeding your baby.  Solution  · Breastfeed when you feel the need to reduce the fullness of your breasts or when your baby shows signs of hunger. This is called "breastfeeding on demand."  · Newborns (babies younger than 4 weeks) often breastfeed every 1-3 hours during the day. You may need to awaken your baby to feed if he or she is asleep at a feeding time.  · Do not allow your baby to sleep longer than 5 hours during the night without a feeding.  · Pump or hand express breast milk before breastfeeding to soften your breast, areola, and nipple.  · Apply warm, moist heat (in the shower or with warm water-soaked hand towels) just before feeding or pumping, or massage your breast before or during breastfeeding. This increases circulation and helps your milk to flow.  · Completely empty your breasts when breastfeeding or pumping. Afterward, wear a snug bra (nursing or regular) or tank top for 1-2 days to signal your body to slightly decrease milk production. Only wear snug bras or tank tops to treat engorgement. Tight bras typically should be avoided by breastfeeding mothers. Once engorgement is relieved, return to wearing regular, loose-fitting clothes.  · Apply ice packs to your breasts to lessen the pain from engorgement and relieve swelling, unless the ice is uncomfortable for you.  · Do not delay feedings. Try to relax when it is time to feed your baby. This helps to trigger your "let-down reflex," which releases milk from your breast.  · Ensure your baby is latched on to your breast and positioned properly while breastfeeding.  · Allow your baby to remain at your breast as long as he or she is latched on well and actively sucking. Your baby will let you know when he or she is done breastfeeding by pulling away from your breast or falling asleep.  · Avoid introducing bottles  or pacifiers to your baby in the early weeks of breastfeeding. Wait to introduce these things until after resolving any breastfeeding challenges.  · Try to pump your milk on the same schedule as when your baby would breastfeed if you are returning to work or away from home for an extended period.  · Drink plenty of fluids to avoid dehydration, which can eventually put you at greater risk of breast engorgement.    If you follow these suggestions, your engorgement should improve in 24-48 hours. If you are still experiencing difficulty, call your lactation consultant or health care provider.  Challenge--Plugged Milk Ducts  Plugged milk ducts occur when the duct does not drain milk effectively and becomes swollen. Wearing a tight-fitting nursing bra or having difficulty with latching may cause plugged milk ducts. Not drinking enough water (8-10 c [1.9-2.4 L] per day) can contribute to plugged milk ducts. Once a   duct has become plugged, hard lumps, soreness, and redness may develop in your breast.  Solution  Do not delay feedings. Feed your baby frequently and try to empty your breasts of milk at each feeding. Try breastfeeding from the affected side first so there is a better chance that the milk will drain completely from that breast. Apply warm, moist towels to your breasts for 5-10 minutes before feeding. Alternatively, a hot shower right before breastfeeding can provide the moist heat that can encourage milk flow. Gentle massage of the sore area before and during a feeding may also help. Avoid wearing tight clothing or bras that put pressure on your breasts. Wear bras that offer good support to your breasts, but avoid underwire bras. If you have a plugged milk duct and develop a fever, you need to see your health care provider.  Challenge--Mastitis  Mastitis is inflammation of your breast. It usually is caused by a bacterial infection and can cause flu-like symptoms. You may develop redness in your breast and a  fever. Often when mastitis occurs, your breast becomes firm, warm, and very painful. The most common causes of mastitis are poor latching, ineffective sucking from your baby, consistent pressure on your breast (possibly from wearing a tight-fitting bra or shirt that restricts the milk flow), unusual stress or fatigue, or missed feedings.  Solution  You will be given antibiotic medicine to treat the infection. It is still important to breastfeed frequently to empty your breasts. Continuing to breastfeed while you recover from mastitis will not harm your baby. Make sure your baby is positioned properly during every feeding. Apply moist heat to your breasts for a few minutes before feeding to help the milk flow and to help your breasts empty more easily.  Challenge--Thrush  Thrush is a yeast infection that can form on your nipples, in your breast, or in your baby's mouth. It causes itching, soreness, burning or stabbing pain, and sometimes a rash.  Solution  You will be given a medicated ointment for your nipples, and your baby will be given a liquid medicine for his or her mouth. It is important that you and your baby are treated at the same time because thrush can be passed between you and your baby. Change disposable nursing pads often. Any bras, towels, or clothing that come in contact with infected areas of your body or your baby's body need to be washed in very hot water every day. Wash your hands and your baby's hands often. All pacifiers, bottle nipples, or toys your baby puts in his or her mouth should be boiled once a day for 20 minutes. After 1 week of treatment, discard pacifiers and bottle nipples and buy new ones. All breast pump parts that touch the milk need to be boiled for 20 minutes every day.  Challenge--Low Milk Supply  You may not be producing enough milk if your baby is not gaining the proper amount of weight. Breast milk production is based on a supply-and-demand system. Your milk supply depends  on how frequently and effectively your baby empties your breast.  Solution  The more you breastfeed and pump, the more breast milk you will produce. It is important that your baby empties at least one of your breasts at each feeding. If this is not happening, then use a breast pump or hand express any milk that remains. This will help to drain as much milk as possible at each feeding. It will also signal your body to produce more   milk. If your baby is not emptying your breasts, it may be due to latching, sucking, or positioning problems. If low milk supply continues after addressing these issues, contact your health care provider or a lactation specialist as soon as possible.  Challenge--Inverted or Flat Nipples  Some women have nipples that turn inward instead of protruding outward. Other women have nipples that are flat. Inverted or flat nipples can sometimes make it more difficult for your baby to latch onto your breast.  Solution  You may be given a small device that pulls out inverted nipples. This device should be applied right before your baby is brought to your breast. You can also try using a breast pump for a short time before placing the baby at your breast. The pump can pull your nipple outwards to help your infant latch more easily. The baby's sucking motion will help the inverted nipple protrude as well.  If you have flat nipples, encourage your baby to latch onto your breast and feed frequently in the early days after birth. This will give your baby practice latching on correctly while your breast is still soft. When your milk supply increases, between the second and fifth day after birth and your breasts become full, your baby will have an easier time latching.  Contact a lactation consultant if you still have concerns. She or he can teach you additional techniques to address breastfeeding problems related to nipple shape and position.  Where to find more information:  [employer]:  www.llli.org  This information is not intended to replace advice given to you by your health care provider. Make sure you discuss any questions you have with your health care provider.  Document Released: 05/14/2006 Document Revised: 05/03/2016 Document Reviewed: 05/16/2013  Elsevier Interactive Patient Education © 2017 [employer]

## 2018-02-27 ENCOUNTER — Other Ambulatory Visit (HOSPITAL_COMMUNITY)
Admission: RE | Admit: 2018-02-27 | Discharge: 2018-02-27 | Disposition: A | Discharge status: Home / Self Care | Payer: Medicaid Other | Source: Ambulatory Visit | Attending: Advanced Practice Midwife | Admitting: Advanced Practice Midwife

## 2018-02-27 ENCOUNTER — Encounter: Payer: Self-pay | Admitting: Advanced Practice Midwife

## 2018-02-27 ENCOUNTER — Ambulatory Visit (INDEPENDENT_AMBULATORY_CARE_PROVIDER_SITE_OTHER): Payer: Medicaid Other | Admitting: Advanced Practice Midwife

## 2018-02-27 VITALS — BP 112/66 | HR 114 | Wt 224.4 lb

## 2018-02-27 DIAGNOSIS — B951 Streptococcus, group B, as the cause of diseases classified elsewhere: Secondary | ICD-10-CM

## 2018-02-27 DIAGNOSIS — A749 Chlamydial infection, unspecified: Secondary | ICD-10-CM | POA: Insufficient documentation

## 2018-02-27 DIAGNOSIS — Z3400 Encounter for supervision of normal first pregnancy, unspecified trimester: Secondary | ICD-10-CM

## 2018-02-27 DIAGNOSIS — Z3A38 38 weeks gestation of pregnancy: Secondary | ICD-10-CM | POA: Diagnosis not present

## 2018-02-27 DIAGNOSIS — O98819 Other maternal infectious and parasitic diseases complicating pregnancy, unspecified trimester: Secondary | ICD-10-CM

## 2018-02-27 DIAGNOSIS — O98813 Other maternal infectious and parasitic diseases complicating pregnancy, third trimester: Secondary | ICD-10-CM | POA: Diagnosis present

## 2018-02-27 NOTE — Progress Notes (Incomplete)
   PRENATAL VISIT NOTE  Subjective:  Angelica Cruz is a 22 y.o. G1P0 at 3787w6d being seen today for ongoing prenatal care.  She is currently monitored for the following issues for this {Blank single:19197::"high-risk","low-risk"} pregnancy and has Supervision of normal first pregnancy, antepartum; Chlamydia infection affecting pregnancy; and Group B streptococcal infection during pregnancy on their problem list.  Patient reports {sx:[id number]}.  Contractions: Not present. Vag. Bleeding: None.  Movement: Present. Denies leaking of fluid.   The following portions of the patient's history were reviewed and updated as appropriate: allergies, current medications, past family history, past medical history, past social history, past surgical history and problem list. Problem list updated.  Objective:   Vitals:   02/27/18 1030  BP: 112/66  Pulse: (!) 114  Weight: 224 lb 6.4 oz (101.8 kg)    Fetal Status: Fetal Heart Rate (bpm): 145 Fundal Height: 39 cm Movement: Present  Presentation: Vertex  General:  Alert, oriented and cooperative. Patient is in no acute distress.  Skin: Skin is warm and dry. No rash noted.   Cardiovascular: Normal heart rate noted  Respiratory: Normal respiratory effort, no problems with respiration noted  Abdomen: Soft, gravid, appropriate for gestational age.  Pain/Pressure: Present     Pelvic: {Blank single:19197::"Cervical exam performed","Cervical exam deferred"} Dilation: 1.5 Effacement (%): 50 Station: -3  Extremities: Normal range of motion.  Edema: None  Mental Status:  Normal mood and affect. Normal behavior. Normal judgment and thought content.   Assessment and Plan:  Pregnancy: G1P0 at 8087w6d  1. Chlamydia infection affecting pregnancy in third trimester *** - [facility]  2. Group B streptococcal infection during pregnancy ***  3. Supervision of normal first pregnancy, antepartum ***  {Blank  single:19197::"Term","Preterm"} labor symptoms and general obstetric precautions including but not limited to vaginal bleeding, contractions, leaking of fluid and fetal movement were reviewed in detail with the patient. Please refer to After Visit Summary for other counseling recommendations.  Return in about 1 week (around 03/06/2018) for ROB.   Dorathy KinsmanVirginia Ramy Greth, CNM

## 2018-02-27 NOTE — Progress Notes (Signed)
   PRENATAL VISIT NOTE  Subjective:  Angelica Cruz is a [age] y.o. G1P0 at 8594w6d being seen today for ongoing prenatal care.  She is currently monitored for the following issues for this low-risk pregnancy and has Supervision of normal first pregnancy, antepartum; Chlamydia infection affecting pregnancy; and Group B streptococcal infection during pregnancy on their problem list.  Patient reports occasional contractions.  Contractions: Not present. Vag. Bleeding: None.  Movement: Present. Denies leaking of fluid.   The following portions of the patient's history were reviewed and updated as appropriate: allergies, current medications, past family history, past medical history, past social history, past surgical history and problem list. Problem list updated.  Objective:   Vitals:   02/27/18 1030  BP: 112/66  Pulse: (!) 114  Weight: 224 lb 6.4 oz (101.8 kg)    Fetal Status: Fetal Heart Rate (bpm): 145 Fundal Height: 39 cm Movement: Present  Presentation: Vertex  General:  Alert, oriented and cooperative. Patient is in no acute distress.  Skin: Skin is warm and dry. No rash noted.   Cardiovascular: Normal heart rate noted  Respiratory: Normal respiratory effort, no problems with respiration noted  Abdomen: Soft, gravid, appropriate for gestational age.  Pain/Pressure: Present     Pelvic: Cervical exam performed Dilation: 1.5 Effacement (%): 50 Station: -3  Extremities: Normal range of motion.  Edema: None  Mental Status:  Normal mood and affect. Normal behavior. Normal judgment and thought content.   Assessment and Plan:  Pregnancy: G1P0 at 4794w6d  1. Chlamydia infection affecting pregnancy in third trimester  - [facility]  2. Group B streptococcal infection during pregnancy - PCN in labor  3. Supervision of normal first pregnancy, antepartum   Term labor symptoms and general obstetric precautions including but not limited to vaginal  bleeding, contractions, leaking of fluid and fetal movement were reviewed in detail with the patient. Please refer to After Visit Summary for other counseling recommendations.  Return in about 1 week (around 03/06/2018) for ROB.   Dorathy KinsmanVirginia Alycen Mack, CNM

## 2018-02-28 LAB — GC/CHLAMYDIA PROBE AMP (~~LOC~~) NOT AT ARMC
Chlamydia: NEGATIVE
NEISSERIA GONORRHEA: NEGATIVE

## 2018-03-05 ENCOUNTER — Inpatient Hospital Stay (HOSPITAL_COMMUNITY)
Admission: AD | Admit: 2018-03-05 | Discharge: 2018-03-07 | DRG: 807 | Disposition: A | Discharge status: Home / Self Care | Payer: Medicaid Other | Source: Ambulatory Visit | Attending: Family Medicine | Admitting: Family Medicine

## 2018-03-05 ENCOUNTER — Encounter (HOSPITAL_COMMUNITY): Payer: Self-pay | Admitting: *Deleted

## 2018-03-05 ENCOUNTER — Other Ambulatory Visit: Payer: Self-pay

## 2018-03-05 DIAGNOSIS — Z3A39 39 weeks gestation of pregnancy: Secondary | ICD-10-CM

## 2018-03-05 DIAGNOSIS — Z87891 Personal history of nicotine dependence: Secondary | ICD-10-CM | POA: Diagnosis not present

## 2018-03-05 DIAGNOSIS — Z3400 Encounter for supervision of normal first pregnancy, unspecified trimester: Secondary | ICD-10-CM

## 2018-03-05 DIAGNOSIS — O98819 Other maternal infectious and parasitic diseases complicating pregnancy, unspecified trimester: Secondary | ICD-10-CM

## 2018-03-05 DIAGNOSIS — O99824 Streptococcus B carrier state complicating childbirth: Principal | ICD-10-CM | POA: Diagnosis present

## 2018-03-05 DIAGNOSIS — Z3483 Encounter for supervision of other normal pregnancy, third trimester: Secondary | ICD-10-CM | POA: Diagnosis present

## 2018-03-05 DIAGNOSIS — B951 Streptococcus, group B, as the cause of diseases classified elsewhere: Secondary | ICD-10-CM | POA: Diagnosis present

## 2018-03-05 HISTORY — DX: Chlamydial infection, unspecified: A74.9

## 2018-03-05 LAB — URINALYSIS, ROUTINE W REFLEX MICROSCOPIC
Bacteria, UA: NONE SEEN
Bilirubin Urine: NEGATIVE
GLUCOSE, UA: NEGATIVE mg/dL
Hgb urine dipstick: NEGATIVE
Ketones, ur: NEGATIVE mg/dL
Nitrite: NEGATIVE
PH: 9 — AB (ref 5.0–8.0)
Protein, ur: NEGATIVE mg/dL
SPECIFIC GRAVITY, URINE: 1.009 (ref 1.005–1.030)

## 2018-03-05 LAB — CBC
HCT: 32.9 % — ABNORMAL LOW (ref 36.0–46.0)
Hemoglobin: 10.5 g/dL — ABNORMAL LOW (ref 12.0–15.0)
MCH: 26.4 pg (ref 26.0–34.0)
MCHC: 31.9 g/dL (ref 30.0–36.0)
MCV: 82.7 fL (ref 78.0–100.0)
PLATELETS: 222 10*3/uL (ref 150–400)
RBC: 3.98 MIL/uL (ref 3.87–5.11)
RDW: 15.3 % (ref 11.5–15.5)
WBC: 9.9 10*3/uL (ref 4.0–10.5)

## 2018-03-05 LAB — ABO/RH: ABO/RH(D): O POS

## 2018-03-05 LAB — TYPE AND SCREEN
ABO/RH(D): O POS
Antibody Screen: NEGATIVE

## 2018-03-05 MED ORDER — OXYTOCIN BOLUS FROM INFUSION
500.0000 mL | Freq: Once | INTRAVENOUS | Status: AC
  Administered 2018-03-05: 500 mL via INTRAVENOUS

## 2018-03-05 MED ORDER — OXYCODONE-ACETAMINOPHEN 5-325 MG PO TABS: 1.0000 | ORAL_TABLET | ORAL | Status: DC | PRN

## 2018-03-05 MED ORDER — SENNOSIDES-DOCUSATE SODIUM 8.6-50 MG PO TABS
2.0000 | ORAL_TABLET | ORAL | Status: DC
  Administered 2018-03-05 – 2018-03-06 (×2): 2 via ORAL
  Filled 2018-03-05 (×2): qty 2

## 2018-03-05 MED ORDER — ZOLPIDEM TARTRATE 5 MG PO TABS: 5.0000 mg | ORAL_TABLET | Freq: Every evening | ORAL | Status: DC | PRN

## 2018-03-05 MED ORDER — SODIUM CHLORIDE 0.9 % IV SOLN
5.0000 10*6.[IU] | Freq: Once | INTRAVENOUS | Status: AC
  Administered 2018-03-05: 5 10*6.[IU] via INTRAVENOUS
  Filled 2018-03-05: qty 5

## 2018-03-05 MED ORDER — TETANUS-DIPHTH-ACELL PERTUSSIS 5-2.5-18.5 LF-MCG/0.5 IM SUSP: 0.5000 mL | Freq: Once | INTRAMUSCULAR | Status: DC

## 2018-03-05 MED ORDER — IBUPROFEN 600 MG PO TABS
600.0000 mg | ORAL_TABLET | Freq: Four times a day (QID) | ORAL | Status: DC
  Administered 2018-03-05 – 2018-03-07 (×8): 600 mg via ORAL
  Filled 2018-03-05 (×8): qty 1

## 2018-03-05 MED ORDER — PHENYLEPHRINE 40 MCG/ML (10ML) SYRINGE FOR IV PUSH (FOR BLOOD PRESSURE SUPPORT)
80.0000 ug | PREFILLED_SYRINGE | INTRAVENOUS | Status: DC | PRN
  Filled 2018-03-05: qty 5

## 2018-03-05 MED ORDER — PHENYLEPHRINE 40 MCG/ML (10ML) SYRINGE FOR IV PUSH (FOR BLOOD PRESSURE SUPPORT)
80.0000 ug | PREFILLED_SYRINGE | INTRAVENOUS | Status: DC | PRN
  Filled 2018-03-05: qty 10
  Filled 2018-03-05: qty 5

## 2018-03-05 MED ORDER — LACTATED RINGERS IV SOLN: 500.0000 mL | INTRAVENOUS | Status: DC | PRN

## 2018-03-05 MED ORDER — FENTANYL CITRATE (PF) 100 MCG/2ML IJ SOLN
100.0000 ug | INTRAMUSCULAR | Status: DC | PRN
  Administered 2018-03-05: 100 ug via INTRAVENOUS
  Filled 2018-03-05: qty 2

## 2018-03-05 MED ORDER — SIMETHICONE 80 MG PO CHEW: 80.0000 mg | CHEWABLE_TABLET | ORAL | Status: DC | PRN

## 2018-03-05 MED ORDER — FENTANYL 2.5 MCG/ML BUPIVACAINE 1/10 % EPIDURAL INFUSION (WH - ANES)
14.0000 mL/h | INTRAMUSCULAR | Status: DC | PRN
  Filled 2018-03-05: qty 100

## 2018-03-05 MED ORDER — FLEET ENEMA 7-19 GM/118ML RE ENEM: 1.0000 | ENEMA | RECTAL | Status: DC | PRN

## 2018-03-05 MED ORDER — LACTATED RINGERS IV SOLN
INTRAVENOUS | Status: DC
  Administered 2018-03-05 (×2): via INTRAVENOUS

## 2018-03-05 MED ORDER — COCONUT OIL OIL
1.0000 "application " | TOPICAL_OIL | Status: DC | PRN
  Administered 2018-03-07: 1 via TOPICAL
  Filled 2018-03-05: qty 120

## 2018-03-05 MED ORDER — ONDANSETRON HCL 4 MG/2ML IJ SOLN: 4.0000 mg | Freq: Four times a day (QID) | INTRAMUSCULAR | Status: DC | PRN

## 2018-03-05 MED ORDER — LIDOCAINE HCL (PF) 1 % IJ SOLN
30.0000 mL | INTRAMUSCULAR | Status: AC | PRN
  Administered 2018-03-05: 30 mL via SUBCUTANEOUS
  Filled 2018-03-05: qty 30

## 2018-03-05 MED ORDER — OXYTOCIN 40 UNITS IN LACTATED RINGERS INFUSION - SIMPLE MED
2.5000 [IU]/h | INTRAVENOUS | Status: DC
  Filled 2018-03-05: qty 1000

## 2018-03-05 MED ORDER — ONDANSETRON HCL 4 MG/2ML IJ SOLN: 4.0000 mg | INTRAMUSCULAR | Status: DC | PRN

## 2018-03-05 MED ORDER — PENICILLIN G POT IN DEXTROSE 60000 UNIT/ML IV SOLN
3.0000 10*6.[IU] | INTRAVENOUS | Status: DC
  Filled 2018-03-05 (×6): qty 50

## 2018-03-05 MED ORDER — DIBUCAINE 1 % RE OINT: 1.0000 "application " | TOPICAL_OINTMENT | RECTAL | Status: DC | PRN

## 2018-03-05 MED ORDER — EPHEDRINE 5 MG/ML INJ
10.0000 mg | INTRAVENOUS | Status: DC | PRN
  Filled 2018-03-05: qty 2

## 2018-03-05 MED ORDER — ACETAMINOPHEN 325 MG PO TABS: 650.0000 mg | ORAL_TABLET | ORAL | Status: DC | PRN

## 2018-03-05 MED ORDER — ONDANSETRON HCL 4 MG PO TABS: 4.0000 mg | ORAL_TABLET | ORAL | Status: DC | PRN

## 2018-03-05 MED ORDER — SOD CITRATE-CITRIC ACID 500-334 MG/5ML PO SOLN: 30.0000 mL | ORAL | Status: DC | PRN

## 2018-03-05 MED ORDER — DIPHENHYDRAMINE HCL 50 MG/ML IJ SOLN: 12.5000 mg | INTRAMUSCULAR | Status: DC | PRN

## 2018-03-05 MED ORDER — WITCH HAZEL-GLYCERIN EX PADS: 1.0000 "application " | MEDICATED_PAD | CUTANEOUS | Status: DC | PRN

## 2018-03-05 MED ORDER — PRENATAL MULTIVITAMIN CH
1.0000 | ORAL_TABLET | Freq: Every day | ORAL | Status: DC
  Administered 2018-03-06 – 2018-03-07 (×2): 1 via ORAL
  Filled 2018-03-05 (×2): qty 1

## 2018-03-05 MED ORDER — OXYCODONE-ACETAMINOPHEN 5-325 MG PO TABS: 2.0000 | ORAL_TABLET | ORAL | Status: DC | PRN

## 2018-03-05 MED ORDER — LACTATED RINGERS IV SOLN: 500.0000 mL | Freq: Once | INTRAVENOUS | Status: DC

## 2018-03-05 MED ORDER — BENZOCAINE-MENTHOL 20-0.5 % EX AERO: 1.0000 "application " | INHALATION_SPRAY | CUTANEOUS | Status: DC | PRN

## 2018-03-05 MED ORDER — DIPHENHYDRAMINE HCL 25 MG PO CAPS: 25.0000 mg | ORAL_CAPSULE | Freq: Four times a day (QID) | ORAL | Status: DC | PRN

## 2018-03-05 NOTE — MAU Note (Signed)
Pt presents with c/o sharp pain in lower back that radiates into her abdomen every 4 minutes.  Reports pain began @ 0800 this morning.  Reports red streaks with wiping, denies LOF.  Reports +FM.

## 2018-03-05 NOTE — H&P (Signed)
LABOR AND DELIVERY ADMISSION HISTORY AND PHYSICAL NOTE  Angelica Cruz is a 22 y.o. female G1P0 with IUP at [redacted]w[redacted]d by LMP presenting for SOL.  She reports positive fetal movement. She denies leakage of fluid. Denies any other concerns.  Prenatal History/Complications: PNC at [facility] Pregnancy complications:  - GBS carrier - Chlamydia   Past Medical History: Past Medical History:  Diagnosis Date  . Chlamydia   . Medical history non-contributory     Past Surgical History: Past Surgical History:  Procedure Laterality Date  . NO PAST SURGERIES      Obstetrical History: OB History    Gravida  1   Para      Term      Preterm      AB      Living        SAB      TAB      Ectopic      Multiple      Live Births  0           Social History: Social History   Socioeconomic History  . Marital status: Single    Spouse name: Not on file  . Number of children: Not on file  . Years of education: Not on file  . Highest education level: Not on file  Occupational History  . Not on file  Social Needs  . Financial resource strain: Not on file  . Food insecurity:    Worry: Not on file    Inability: Not on file  . Transportation needs:    Medical: Not on file    Non-medical: Not on file  Tobacco Use  . Smoking status: Former Games developer  . Smokeless tobacco: Never Used  Substance and Sexual Activity  . Alcohol use: No  . Drug use: No  . Sexual activity: Not Currently    Birth control/protection: None  Lifestyle  . Physical activity:    Days per week: Not on file    Minutes per session: Not on file  . Stress: Not on file  Relationships  . Social connections:    Talks on phone: Not on file    Gets together: Not on file    Attends religious service: Not on file    Active member of club or organization: Not on file    Attends meetings of clubs or organizations: Not on file    Relationship status: Not on file  Other Topics Concern  . Not on file  Social  History Narrative  . Not on file    Family History: Family History  Problem Relation Age of Onset  . Hypertension Mother     Allergies: No Known Allergies  No medications prior to admission.     Review of Systems  All systems reviewed and negative except as stated in HPI  Physical Exam Blood pressure 133/75, pulse 95, temperature 97.8 F (36.6 C), temperature source Oral, resp. rate 16, height 5\' 3"  (1.6 m), weight 224 lb 12 oz (101.9 kg), last menstrual period 05/31/2017, SpO2 100 %. General appearance: alert, oriented; breathing through contractions  Lungs: normal respiratory effort Heart: regular rate Abdomen: soft, non-tender; gravid, FH appropriate for GA Extremities: No calf swelling or tenderness Presentation: cephalic Fetal monitoring: baseline rate 145, mod variability, +acel, no decel Uterine activity: ctx q 4-5 min SVE: 6cm/80%/-3  Prenatal labs: ABO, Rh: --/--/O POS (04/02 1207) Antibody: NEG (04/02 1207) Rubella: 1.93 (09/26 1111) RPR: Non Reactive (12/20 1056)  HBsAg: Negative (01/10 1140)  HIV: Non Reactive (12/20 1056)  GC/Chlamydia: positive chlamydia on 02/17/18; neg TOC on 02/07/18 GBS:   positive 2-hr GTT: negative Genetic screening:  Negative Quad screen Anatomy US: normal anatomy  Prenatal Transfer Tool  Maternal Diabetes: No Genetic Screening: Normal Maternal Ultrasounds/Referrals: Normal Fetal Ultrasounds or other Referrals:  None Maternal Substance Abuse:  No Significant Maternal Medications:  None Significant Maternal Lab Results: Lab values include: Group B Strep positive  Results for orders placed or performed during the hospital encounter of 03/05/18 (from the past 24 hour(s))  Urinalysis, Routine w reflex microscopic   Collection Time: 03/05/18  9:40 AM  Result Value Ref Range   Color, Urine YELLOW YELLOW   APPearance CLEAR CLEAR   Specific Gravity, Urine 1.009 1.005 - 1.030   pH 9.0 (H) 5.0 - 8.0   Glucose, UA NEGATIVE  NEGATIVE mg/dL   Hgb urine dipstick NEGATIVE NEGATIVE   Bilirubin Urine NEGATIVE NEGATIVE   Ketones, ur NEGATIVE NEGATIVE mg/dL   Protein, ur NEGATIVE NEGATIVE mg/dL   Nitrite NEGATIVE NEGATIVE   Leukocytes, UA TRACE (A) NEGATIVE   RBC / HPF 0-5 0 - 5 RBC/hpf   WBC, UA 0-5 0 - 5 WBC/hpf   Bacteria, UA NONE SEEN NONE SEEN   Squamous Epithelial / LPF 0-5 (A) NONE SEEN   Mucus PRESENT   CBC   Collection Time: 03/05/18 12:07 PM  Result Value Ref Range   WBC 9.9 4.0 - 10.5 K/uL   RBC 3.98 3.87 - 5.11 MIL/uL   Hemoglobin 10.5 (L) 12.0 - 15.0 g/dL   HCT 96.032.9 (L) 45.436.0 - 09.846.0 %   MCV 82.7 78.0 - 100.0 fL   MCH 26.4 26.0 - 34.0 pg   MCHC 31.9 30.0 - 36.0 g/dL   RDW 11.915.3 14.711.5 - 82.915.5 %   Platelets 222 150 - 400 K/uL  Type and screen [facility]   Collection Time: 03/05/18 12:07 PM  Result Value Ref Range   ABO/RH(D) O POS    Antibody Screen NEG    Sample Expiration      03/08/2018 Performed at [facility], [address] [place], [place] [id number]     Patient Active Problem List   Diagnosis Date Noted  . Normal labor 03/05/2018  . Group B streptococcal infection during pregnancy 02/14/2018  . Chlamydia infection affecting pregnancy 09/01/2017  . Supervision of normal first pregnancy, antepartum 08/29/2017    Assessment: Angelica Curbudrey C Cruz is a 22 y.o. G1P0 at 6324w5d here for SOL  #Labor: Expectant mamangement #Pain: Would like epidural #FWB: Cat I #ID:  GBS pos, start PCN #MOF: breast #MOC: Purvis Sheffieldexplanon   Maleka Contino P Liani Caris 03/05/2018

## 2018-03-06 ENCOUNTER — Other Ambulatory Visit: Payer: Self-pay

## 2018-03-06 ENCOUNTER — Encounter: Payer: Medicaid Other | Admitting: Nurse Practitioner

## 2018-03-06 LAB — RPR: RPR: NONREACTIVE

## 2018-03-06 NOTE — Progress Notes (Signed)
POSTPARTUM PROGRESS NOTE  Post Partum Day 1  Subjective:  Angelica Cruz is a 22 y.o. G1P1001 s/p SVD at 966w5d.  No acute events overnight.  Pt denies problems with ambulating, voiding or po intake.  She denies nausea or vomiting.  Pain is well controlled. Lochia is moderate. Denies any concerns.   Objective: Blood pressure 116/72, pulse 88, temperature 98.2 F (36.8 C), temperature source Oral, resp. rate 18, height 5\' 3"  (1.6 m), weight 224 lb 12 oz (101.9 kg), last menstrual period 05/31/2017, SpO2 97 %, unknown if currently breastfeeding.  Physical Exam:  General: alert, cooperative and no distress Chest: no respiratory distress Heart:regular rate, distal pulses intact Abdomen: soft, nontender,  Uterine Fundus: firm, appropriately tender DVT Evaluation: No calf swelling or tenderness Extremities: no edema Skin: warm, dry  Recent Labs    03/05/18 1207  HGB 10.5*  HCT 32.9*    Assessment/Plan: Angelica Curbudrey C Escudero is a 22 y.o. G1P1001 s/p SVD at 1566w5d   PPD#1 - Doing well. Routine PP care Contraception: Nexplanon Feeding: breast Dispo: Plan for discharge tomorrow.   LOS: 1 day   Kandra NicolasJulie P DegeleMD 03/06/2018, 8:46 AM

## 2018-03-06 NOTE — Lactation Note (Signed)
This note was copied from a baby's chart. Lactation Consultation Note Baby 14 hrs old. Mom states BF going ok.  Mom has pendulous breast w/everted nipple at bottom end of breast. Hand express w/colostrum noted.  Discussed positions. Mom demonstrated cradle and football. Assisted w/positioning and body alignment.  Mom had baby swaddled. Encouraged to unwrap baby BF STS. Mom stated noted to be helpful. Denies painful latches. occasional clicking noted. Breast soft. Encouraged to firm some to help baby obtain firm latch.  Newborn feeding habits, I&O, cluster feeding, and breast massage discussed. Mom encouraged to feed baby 8-12 times/24 hours and with feeding cues. Mom encouraged to waken baby for feeds if hasn't cued in 3 hrs. WH/LC brochure given w/resources, support groups and LC services.  Patient Name: Angelica Cruz JYNWG'NToday's Date: 03/06/2018 Reason for consult: Initial assessment   Maternal Data Has patient been taught Hand Expression?: Yes Does the patient have breastfeeding experience prior to this delivery?: No  Feeding Feeding Type: Breast Fed Length of feed: 10 min(still BF)  LATCH Score Latch: Repeated attempts needed to sustain latch, nipple held in mouth throughout feeding, stimulation needed to elicit sucking reflex.  Audible Swallowing: A few with stimulation  Type of Nipple: Everted at rest and after stimulation  Comfort (Breast/Nipple): Soft / non-tender  Hold (Positioning): Assistance needed to correctly position infant at breast and maintain latch.  LATCH Score: 7  Interventions Interventions: Breast feeding basics reviewed;Support pillows;Assisted with latch;Position options;Skin to skin;Breast massage;Hand express;Breast compression;Adjust position  Lactation Tools Discussed/Used WIC Program: Yes   Consult Status Consult Status: Follow-up Date: 03/07/18 Follow-up type: In-patient    Charyl DancerCARVER, Lylee Corrow G 03/06/2018, 3:35 AM

## 2018-03-07 MED ORDER — IBUPROFEN 600 MG PO TABS: 600.0000 mg | ORAL_TABLET | Freq: Four times a day (QID) | ORAL | 0 refills | Status: DC

## 2018-03-07 NOTE — Lactation Note (Signed)
This note was copied from a baby's chart. Lactation Consultation Note Baby 37 hrs old. Mom using DEBP when LC entered rm. Mom states nipples are sore from baby BF so much. Mom pumping colostrum and giving to baby w/spoon. Discussed baby I&O, feeding habits, support bra, engorgement, management, milk storage, and pumping. Mom has WIC. Mom has hand pump at home.  Encouraged mom to ask for assistance or questions if needed.  Patient Name: Angelica Oneita Hurtudrey Solarz WUJWJ'XToday's Date: 03/07/2018 Reason for consult: Follow-up assessment   Maternal Data    Feeding Feeding Type: Breast Fed Length of feed: 20 min  LATCH Score       Type of Nipple: Everted at rest and after stimulation  Comfort (Breast/Nipple): Filling, red/small blisters or bruises, mild/mod discomfort        Interventions Interventions: Breast feeding basics reviewed;DEBP  Lactation Tools Discussed/Used Tools: Pump Breast pump type: Double-Electric Breast Pump   Consult Status Consult Status: Complete Date: 03/07/18    Charyl DancerCARVER, Angelica Cruz 03/07/2018, 2:20 AM

## 2018-03-07 NOTE — Discharge Instructions (Signed)
Vaginal Delivery, Care After °Refer to this sheet in the next few weeks. These instructions provide you with information about caring for yourself after vaginal delivery. Your health care provider may also give you more specific instructions. Your treatment has been planned according to current medical practices, but problems sometimes occur. Call your health care provider if you have any problems or questions. °What can I expect after the procedure? °After vaginal delivery, it is common to have: °· Some bleeding from your vagina. °· Soreness in your abdomen, your vagina, and the area of skin between your vaginal opening and your anus (perineum). °· Pelvic cramps. °· Fatigue. ° °Follow these instructions at home: °Medicines °· Take over-the-counter and prescription medicines only as told by your health care provider. °· If you were prescribed an antibiotic medicine, take it as told by your health care provider. Do not stop taking the antibiotic until it is finished. °Driving ° °· Do not drive or operate heavy machinery while taking prescription pain medicine. °· Do not drive for 24 hours if you received a sedative. °Lifestyle °· Do not drink alcohol. This is especially important if you are breastfeeding or taking medicine to relieve pain. °· Do not use tobacco products, including cigarettes, chewing tobacco, or e-cigarettes. If you need help quitting, ask your health care provider. °Eating and drinking °· Drink at least 8 eight-ounce glasses of water every day unless you are told not to by your health care provider. If you choose to breastfeed your baby, you may need to drink more water than this. °· Eat high-fiber foods every day. These foods may help prevent or relieve constipation. High-fiber foods include: °? Whole grain cereals and breads. °? Brown rice. °? Beans. °? Fresh fruits and vegetables. °Activity °· Return to your normal activities as told by your health care provider. Ask your health care provider  what activities are safe for you. °· Rest as much as possible. Try to rest or take a nap when your baby is sleeping. °· Do not lift anything that is heavier than your baby or 10 lb (4.5 kg) until your health care provider says that it is safe. °· Talk with your health care provider about when you can engage in sexual activity. This may depend on your: °? Risk of infection. °? Rate of healing. °? Comfort and desire to engage in sexual activity. °Vaginal Care °· If you have an episiotomy or a vaginal tear, check the area every day for signs of infection. Check for: °? More redness, swelling, or pain. °? More fluid or blood. °? Warmth. °? Pus or a bad smell. °· Do not use tampons or douches until your health care provider says this is safe. °· Watch for any blood clots that may pass from your vagina. These may look like clumps of dark red, brown, or black discharge. °General instructions °· Keep your perineum clean and dry as told by your health care provider. °· Wear loose, comfortable clothing. °· Wipe from front to back when you use the toilet. °· Ask your health care provider if you can shower or take a bath. If you had an episiotomy or a perineal tear during labor and delivery, your health care provider may tell you not to take baths for a certain length of time. °· Wear a bra that supports your breasts and fits you well. °· If possible, have someone help you with household activities and help care for your baby for at least a few days after   you leave the hospital. °· Keep all follow-up visits for you and your baby as told by your health care provider. This is important. °Contact a health care provider if: °· You have: °? Vaginal discharge that has a bad smell. °? Difficulty urinating. °? Pain when urinating. °? A sudden increase or decrease in the frequency of your bowel movements. °? More redness, swelling, or pain around your episiotomy or vaginal tear. °? More fluid or blood coming from your episiotomy or  vaginal tear. °? Pus or a bad smell coming from your episiotomy or vaginal tear. °? A fever. °? A rash. °? Little or no interest in activities you used to enjoy. °? Questions about caring for yourself or your baby. °· Your episiotomy or vaginal tear feels warm to the touch. °· Your episiotomy or vaginal tear is separating or does not appear to be healing. °· Your breasts are painful, hard, or turn red. °· You feel unusually sad or worried. °· You feel nauseous or you vomit. °· You pass large blood clots from your vagina. If you pass a blood clot from your vagina, save it to show to your health care provider. Do not flush blood clots down the toilet without having your health care provider look at them. °· You urinate more than usual. °· You are dizzy or light-headed. °· You have not breastfed at all and you have not had a menstrual period for 12 weeks after delivery. °· You have stopped breastfeeding and you have not had a menstrual period for 12 weeks after you stopped breastfeeding. °Get help right away if: °· You have: °? Pain that does not go away or does not get better with medicine. °? Chest pain. °? Difficulty breathing. °? Blurred vision or spots in your vision. °? Thoughts about hurting yourself or your baby. °· You develop pain in your abdomen or in one of your legs. °· You develop a severe headache. °· You faint. °· You bleed from your vagina so much that you fill two sanitary pads in one hour. °This information is not intended to replace advice given to you by your health care provider. Make sure you discuss any questions you have with your health care provider. °Document Released: 11/17/2000 Document Revised: 05/03/2016 Document Reviewed: 12/05/2015 °Elsevier Interactive Patient Education © 2018 [employer] Inc[employer] ° °

## 2018-03-07 NOTE — Lactation Note (Signed)
This note was copied from a baby's chart. Lactation Consultation Note: Mom getting ready to latch baby as I went into room. Easily latched baby by herself. Reports no pain with this latch. Nipples intact- using coconut oil.Does not have pump for home .I showed her how to use pump pieces as double manual pump.  No questions at present. Reviewed our phone number, OP appointments and [facility] as resources for support after DC. To call prn  Patient Name: Angelica Cruz Reason for consult: Follow-up assessment   Maternal Data Formula Feeding for Exclusion: No Has patient been taught Hand Expression?: Yes Does the patient have breastfeeding experience prior to this delivery?: No  Feeding Feeding Type: Breast Fed Length of feed: 10 min  LATCH Score Latch: Grasps breast easily, tongue down, lips flanged, rhythmical sucking.  Audible Swallowing: A few with stimulation  Type of Nipple: Everted at rest and after stimulation  Comfort (Breast/Nipple): Soft / non-tender  Hold (Positioning): No assistance needed to correctly position infant at breast.  LATCH Score: 9  Interventions Interventions: Breast feeding basics reviewed  Lactation Tools Discussed/Used WIC Program: Yes   Consult Status Consult Status: Complete    Pamelia HoitWeeks, Oneal Biglow D Cruz, 9:38 AM

## 2018-03-07 NOTE — Discharge Summary (Signed)
OB Discharge Summary     Patient Name: Angelica Cruz DOB: May 22, 1996 MRN: [id number]  Date of admission: 03/05/2018 Delivering MD: Frederik Pear   Date of discharge: 03/07/2018  Admitting diagnosis: 39WKS, SHARP PAIN IN BACK Intrauterine pregnancy: [redacted]w[redacted]d     Secondary diagnosis:  Principal Problem:   SVD (spontaneous vaginal delivery) Active Problems:   Supervision of normal first pregnancy, antepartum   Group B streptococcal infection during pregnancy   Normal labor  Additional problems: none     Discharge diagnosis: Term Pregnancy Delivered                                                                                                Post partum procedures:none  Augmentation: AROM  Complications: None  Hospital course:  Onset of Labor With Vaginal Delivery     22 y.o. yo G1P1001 at [redacted]w[redacted]d was admitted in Active Labor on 03/05/2018. Patient had an uncomplicated labor course as follows:  Membrane Rupture Time/Date: 12:54 PM ,03/05/2018   Intrapartum Procedures: Episiotomy: None [1]                                         Lacerations:  1st degree [2];Perineal [11]  Patient had a delivery of a Viable infant. 03/05/2018  Information for the patient's newborn:  Trysten, Berti [[id number]]  Delivery Method: Vag-Spont    Pateint had an uncomplicated postpartum course.  She is ambulating, tolerating a regular diet, passing flatus, and urinating well. Patient is discharged home in stable condition on 03/07/18.   Physical exam  Vitals:   03/06/18 0336 03/06/18 0559 03/06/18 1737 03/07/18 0500  BP: 114/66 116/72 127/65 120/60  Pulse: 85 88 89 74  Resp: 18 18 16 18   Temp: 97.9 F (36.6 C) 98.2 F (36.8 C) 97.6 F (36.4 C) 98 F (36.7 C)  TempSrc: Oral Oral Oral Oral  SpO2: 97% 97% 100% 100%  Weight:      Height:       General: alert, cooperative and no distress Lochia: appropriate Uterine Fundus: firm Incision: N/A DVT Evaluation: No evidence of DVT seen  on physical exam. Labs: Lab Results  Component Value Date   WBC 9.9 03/05/2018   HGB 10.5 (L) 03/05/2018   HCT 32.9 (L) 03/05/2018   MCV 82.7 03/05/2018   PLT 222 03/05/2018   No flowsheet data found.  Discharge instruction: per After Visit Summary and "Baby and Me Booklet".  After visit meds:  Allergies as of 03/07/2018   No Known Allergies     Medication List    TAKE these medications   ibuprofen 600 MG tablet Commonly known as:  ADVIL,MOTRIN Take 1 tablet (600 mg total) by mouth every 6 (six) hours.       Diet: routine diet  Activity: Advance as tolerated. Pelvic rest for 6 weeks.   Outpatient follow up:4 weeks Follow up Appt:No future appointments. Follow up Visit:No follow-ups on file.  Postpartum contraception: Nexplanon  Newborn Data: Live born female  Birth Weight: 6 lb 10 oz (3005 g) APGAR: 8, 9  Newborn Delivery   Birth date/time:  03/05/2018 13:00:00 Delivery type:  Vaginal, Spontaneous     Baby Feeding: Breast Disposition:home with mother   03/07/2018 Wynelle BourgeoisMarie Abdiel Blackerby, CNM

## 2018-04-09 ENCOUNTER — Ambulatory Visit (INDEPENDENT_AMBULATORY_CARE_PROVIDER_SITE_OTHER): Payer: Medicaid Other | Admitting: Obstetrics and Gynecology

## 2018-04-09 ENCOUNTER — Encounter: Payer: Self-pay | Admitting: Obstetrics and Gynecology

## 2018-04-09 DIAGNOSIS — Z3202 Encounter for pregnancy test, result negative: Secondary | ICD-10-CM | POA: Diagnosis not present

## 2018-04-09 DIAGNOSIS — Z3046 Encounter for surveillance of implantable subdermal contraceptive: Secondary | ICD-10-CM

## 2018-04-09 DIAGNOSIS — Z1389 Encounter for screening for other disorder: Secondary | ICD-10-CM | POA: Diagnosis not present

## 2018-04-09 DIAGNOSIS — Z308 Encounter for other contraceptive management: Secondary | ICD-10-CM

## 2018-04-09 LAB — POCT PREGNANCY, URINE: PREG TEST UR: NEGATIVE

## 2018-04-09 MED ORDER — ETONOGESTREL 68 MG ~~LOC~~ IMPL
68.0000 mg | DRUG_IMPLANT | Freq: Once | SUBCUTANEOUS | Status: AC
  Administered 2018-04-09: 68 mg via SUBCUTANEOUS

## 2018-04-09 NOTE — Progress Notes (Signed)
Subjective:     Angelica Cruz is a 22 y.o. female who presents for a postpartum visit. She is 5 weeks postpartum following a spontaneous vaginal delivery. I have fully reviewed the prenatal and intrapartum course. The delivery was at 39/5 gestational weeks. Outcome: spontaneous vaginal delivery. Anesthesia: none. Postpartum course has been uncomplicated. Baby's course has been uncomplicated. Baby is feeding by bottle - Gerber Gentle. Bleeding currently due to menstruation starting back. . Bowel function is normal. Bladder function is normal. Patient is not sexually active. Contraception method is none; desires Nexplanon. Postpartum depression screening: negative.  The following portions of the patient's history were reviewed and updated as appropriate: allergies, current medications, past family history, past medical history, past social history, past surgical history and problem list.  Review of Systems Pertinent items are noted in HPI.   Objective:    BP 120/72   Pulse 66   Ht  (1.6 m)   Wt 203 lb (92.1 kg)   Breastfeeding? No   BMI 35.96 kg/m   General:  alert, cooperative and no distress   Breasts:  negative  Lungs: normal respiratory effort  Heart:  regular rate and rhythm  Abdomen: soft, non-tender; bowel sounds normal; no masses,  no organomegaly   Pelvic:  deferred  Psych: normal mood and affect  Skin:  warm and dry        Assessment:   Normal postpartum exam. Pap smear not done at today's visit.   Plan:   1. Contraception: Nexplanon inserted today. Please see procedure note below.  2. Patient is medically cleared to resume all activities of daily living. Patient advised to use condoms with every sexual encounter for STD prevention 3. Follow up in: 1 year or as needed.    Caryl Ada, DO OB Fellow [facility]   PROCEDURE NOTE: Nexplanon Insertion  Patient given informed consent, signed copy in the chart, time out  was performed.  Pregnancy test was negative.  Patient's left arm was prepped and draped in the usual sterile fashion. The ruler used to measure and mark insertion area. Pt was prepped with alcohol swab and then injected with 3 cc of 1% lidocaine with epinephrine used to anesthetize the area.  Pt was prepped with betadine, nexplanon removed from packaging, device confirmed in needle, then inserted full length of needle and withdrawn per manufacturer's instructions. Minimal blood loss. The insertion site covered with guaze and a pressure bandage to minimize bruising. There were no complications and the patient tolerated the procedure well.  Device information was given in handout form. Patient is informed the removal date will be in three years and package insert card filled out and given to her.

## 2018-04-22 ENCOUNTER — Encounter: Payer: Self-pay | Admitting: *Deleted

## 2018-11-02 ENCOUNTER — Emergency Department (HOSPITAL_COMMUNITY)
Admission: EM | Admit: 2018-11-02 | Discharge: 2018-11-02 | Disposition: A | Discharge status: Home / Self Care | Payer: Medicaid Other | Attending: Emergency Medicine | Admitting: Emergency Medicine

## 2018-11-02 ENCOUNTER — Emergency Department (HOSPITAL_COMMUNITY): Payer: Medicaid Other

## 2018-11-02 ENCOUNTER — Other Ambulatory Visit: Payer: Self-pay

## 2018-11-02 ENCOUNTER — Encounter (HOSPITAL_COMMUNITY): Payer: Self-pay | Admitting: Emergency Medicine

## 2018-11-02 DIAGNOSIS — Y999 Unspecified external cause status: Secondary | ICD-10-CM | POA: Insufficient documentation

## 2018-11-02 DIAGNOSIS — Y9289 Other specified places as the place of occurrence of the external cause: Secondary | ICD-10-CM | POA: Insufficient documentation

## 2018-11-02 DIAGNOSIS — Z87891 Personal history of nicotine dependence: Secondary | ICD-10-CM | POA: Insufficient documentation

## 2018-11-02 DIAGNOSIS — X509XXA Other and unspecified overexertion or strenuous movements or postures, initial encounter: Secondary | ICD-10-CM | POA: Insufficient documentation

## 2018-11-02 DIAGNOSIS — M25562 Pain in left knee: Secondary | ICD-10-CM | POA: Insufficient documentation

## 2018-11-02 DIAGNOSIS — Y9344 Activity, trampolining: Secondary | ICD-10-CM | POA: Insufficient documentation

## 2018-11-02 NOTE — Discharge Instructions (Signed)
You can take Tylenol or Ibuprofen as directed for pain. You can alternate Tylenol and Ibuprofen every 4 hours. If you take Tylenol at 1pm, then you can take Ibuprofen at 5pm. Then you can take Tylenol again at 9pm.   Follow the RICE (Rest, Ice, Compression, Elevation) protocol as directed.   Wear the knee sleeve for support and stabilization.   Follow-up with referred orthopedic doctor.  Return to emergency department for any worsening pain, redness or swelling of the knee, fevers, numbness/weakness or any other worsening concerning symptoms.

## 2018-11-02 NOTE — ED Notes (Signed)
Declined W/C at D/C and was escorted to lobby by RN. 

## 2018-11-02 NOTE — ED Triage Notes (Signed)
Left knee  Pain [place] Thinks it moved landed on it wrong has been ujsing motrin but not helping

## 2018-11-02 NOTE — ED Notes (Signed)
Pt transported to Xray. 

## 2018-11-02 NOTE — ED Provider Notes (Signed)
[facility]   CSN: [id number] Arrival date & time: 11/02/18  1402     History   Chief Complaint Chief Complaint  Patient presents with  . Knee Pain    HPI Angelica Cruz is a 22 y.o. female who presents for evaluation of left knee pain that is been ongoing for the last 2 weeks.  She states that pain began after she was in a Trendelenburg around.  She reports she has been ambulate on it but does report worsening pain with ambulation.  She has not been able to do her normal activities with exercise secondary to pain.  She has been taking ibuprofen with some improvement in pain but continues to have pain, prompting ED visit today.  No warmth, redness.  No fevers, numbness/weakness.  The history is provided by the patient.    Past Medical History:  Diagnosis Date  . Chlamydia   . Medical history non-contributory     Patient Active Problem List   Diagnosis Date Noted  . Normal labor 03/05/2018  . SVD (spontaneous vaginal delivery) 03/05/2018  . Group B streptococcal infection during pregnancy 02/14/2018  . Chlamydia infection affecting pregnancy 09/01/2017  . Supervision of normal first pregnancy, antepartum 08/29/2017    Past Surgical History:  Procedure Laterality Date  . NO PAST SURGERIES       OB History    Gravida  1   Para  1   Term  1   Preterm  0   AB  0   Living  1     SAB  0   TAB  0   Ectopic  0   Multiple  0   Live Births  1            Home Medications    Prior to Admission medications   Medication Sig Start Date End Date Taking? Authorizing Provider  ibuprofen (ADVIL,MOTRIN) 600 MG tablet Take 1 tablet (600 mg total) by mouth every 6 (six) hours. 03/07/18   Aviva SignsWilliams, Marie L, CNM    Family History Family History  Problem Relation Age of Onset  . Hypertension Mother     Social History Social History   Tobacco Use  . Smoking status: Former Games developermoker  . Smokeless  tobacco: Never Used  Substance Use Topics  . Alcohol use: No  . Drug use: No     Allergies   Patient has no known allergies.   Review of Systems Review of Systems  Musculoskeletal:       Knee pain  Neurological: Negative for weakness and numbness.  All other systems reviewed and are negative.    Physical Exam Updated Vital Signs BP 123/86 (BP Location: Right Arm)   Pulse 88   Temp 97.9 F (36.6 C) (Oral)   Resp 17   Ht 5\' 3"  (1.6 m)   Wt 90.7 kg   LMP 10/28/2018   SpO2 98%   BMI 35.43 kg/m   Physical Exam  Constitutional: She appears well-developed and well-nourished.  HENT:  Head: Normocephalic and atraumatic.  Eyes: Conjunctivae and EOM are normal. Right eye exhibits no discharge. Left eye exhibits no discharge. No scleral icterus.  Cardiovascular:  Pulses:      Dorsalis pedis pulses are 2+ on the right side.  Pulmonary/Chest: Effort normal.  Musculoskeletal:  Tenderness to palpation of the anterior medial aspect the left knee.  Mild overlying soft tissue swelling.  No overlying erythema, warmth.  No deformity or crepitus  noted.  Negative anterior and posterior drawer test.  Known stability noted on varus or valgus stress.  Tenderness palpation distal tib-fib, ankle.  No tenderness palpation of the right lower extremity.  Full range of motion of right lower extremity without any difficulty.  Neurological: She is alert.  Sensation intact along major nerve distributions of BLE  Skin: Skin is warm and dry. Capillary refill takes less than 2 seconds.  Good distal cap refill.  LLE is not dusky in appearance or cool to touch.  Psychiatric: She has a normal mood and affect. Her speech is normal and behavior is normal.  Nursing note and vitals reviewed.    ED Treatments / Results  Labs (all labs ordered are listed, but only abnormal results are displayed) Labs Reviewed - No data to display  EKG None  Radiology Dg Knee Complete 4 Views Left  Result Date:  11/02/2018 CLINICAL DATA:  Knee pain EXAM: LEFT KNEE - COMPLETE 4+ VIEW COMPARISON:  None. FINDINGS: No evidence of fracture, dislocation, or joint effusion. No evidence of arthropathy or other focal bone abnormality. Soft tissues are unremarkable. IMPRESSION: Negative. Electronically Signed   By: Judie Petit.  Shick M.D.   On: 11/02/2018 14:51    Procedures Procedures (including critical care time)  Medications Ordered in ED Medications - No data to display   Initial Impression / Assessment and Plan / ED Course  I have reviewed the triage vital signs and the nursing notes.  Pertinent labs & imaging results that were available during my care of the patient were reviewed by me and considered in my medical decision making (see chart for details).     22 year old female who presents for evaluation of left knee pain that is been ongoing for last 2 weeks.  She reports [place] and states she thinks she may have landed on it wrong.  Since then has had pain to the anterior medial aspect of the left knee.  She has been able to ambulate and bear weight but does report that when she tries to run or do activities, it worsens her pain.  No warmth, erythema or fevers.  No numbness/weakness. Patient is afebrile, non-toxic appearing, sitting comfortably on examination table. Vital signs reviewed and stable. Patient is neurovascularly intact.  On exam, patient has tenderness palpation noted medial anterior aspect of left knee.  Knee sprain.  Low suspicion for fracture dislocation since patient has been able to ambulate with no difficulty.  Initially, discussed with patient that it could be a meniscal injury.  Will plan for x-ray to rule out any bony abnormality.  Knee x-ray shows no evidence of fracture, dislocation or joint effusion.  Discussed results with patient.  Instructed patient if she is not improved in 2 weeks, she will need to follow-up with outpatient orthopedics for further evaluation  of any ligamentous, muscle, tendon injury.  Encouraged at home supportive care measures. Patient had ample opportunity for questions and discussion. All patient's questions were answered with full understanding. Strict return precautions discussed. Patient expresses understanding and agreement to plan.   Final Clinical Impressions(s) / ED Diagnoses   Final diagnoses:  Acute pain of left knee    ED Discharge Orders    None       Rosana Hoes 11/02/18 1532    Mesner, Barbara Cower, MD 11/02/18 2021

## 2020-06-29 ENCOUNTER — Encounter: Payer: Self-pay | Admitting: Student

## 2020-06-29 ENCOUNTER — Other Ambulatory Visit: Payer: Self-pay

## 2020-06-29 ENCOUNTER — Ambulatory Visit (INDEPENDENT_AMBULATORY_CARE_PROVIDER_SITE_OTHER): Payer: Medicaid Other | Admitting: Student

## 2020-06-29 DIAGNOSIS — Z3046 Encounter for surveillance of implantable subdermal contraceptive: Secondary | ICD-10-CM | POA: Diagnosis not present

## 2020-06-29 NOTE — Progress Notes (Signed)
Pt states not interested in any other BC at this time.

## 2020-06-29 NOTE — Progress Notes (Signed)
      GYNECOLOGY OFFICE PROCEDURE NOTE  Angelica Cruz is a 24 y.o. G1P1001 here for Nexplanon removal.  Last pap smear was not done.  No other gynecologic concerns.  Nexplanon Removal Patient identified, informed consent performed, consent signed.   Appropriate time out taken. Nexplanon site identified.  Area prepped in usual sterile fashon. One ml of 1% lidocaine was used to anesthetize the area at the distal end of the implant. A small stab incision was made right beside the implant on the distal portion.  The Nexplanon rod was grasped using hemostats and removed without difficulty.  There was minimal blood loss. There were no complications.  3 ml of 1% lidocaine was injected around the incision for post-procedure analgesia.  Steri-strips were applied over the small incision.  A pressure bandage was applied to reduce any bruising.  The patient tolerated the procedure well and was given post procedure instructions.  Patient is planning to use condoms for birth control.   Patient will schedule pap smear.   [name]

## 2020-08-16 ENCOUNTER — Ambulatory Visit: Payer: Medicaid Other | Admitting: Student

## 2020-09-16 ENCOUNTER — Other Ambulatory Visit: Payer: Self-pay

## 2020-09-16 ENCOUNTER — Ambulatory Visit (INDEPENDENT_AMBULATORY_CARE_PROVIDER_SITE_OTHER): Payer: Medicaid Other | Admitting: Student

## 2020-09-16 VITALS — BP 120/79 | HR 72 | Ht 63.0 in | Wt 230.1 lb

## 2020-09-16 DIAGNOSIS — O98813 Other maternal infectious and parasitic diseases complicating pregnancy, third trimester: Secondary | ICD-10-CM

## 2020-09-16 DIAGNOSIS — Z113 Encounter for screening for infections with a predominantly sexual mode of transmission: Secondary | ICD-10-CM

## 2020-09-16 DIAGNOSIS — Z01419 Encounter for gynecological examination (general) (routine) without abnormal findings: Secondary | ICD-10-CM

## 2020-09-16 NOTE — Progress Notes (Signed)
  History:  [name] is a 24 y.o. G1P1001 who presents to clinic today for pap smear. No complaints today. Wants to be checked for STIs and have pap smear performed.   The following portions of the patient's history were reviewed and updated as appropriate: allergies, current medications, family history, past medical history, social history, past surgical history and problem list.  Review of Systems:  Review of Systems  Eyes: Negative.   Respiratory: Negative.   Cardiovascular: Negative.   Genitourinary: Negative.   Neurological: Negative.       Objective:  Physical Exam BP 120/79   Pulse 72   Ht 5\' 3"  (1.6 m)   Wt 230 lb 1.6 oz (104.4 kg)   LMP 09/03/2020 (Exact Date)   BMI 40.76 kg/m  Physical Exam Constitutional:      Appearance: Normal appearance.  HENT:     Head: Normocephalic.  Skin:    General: Skin is warm.  Neurological:     Mental Status: She is alert.       Labs and Imaging Results for orders placed or performed in visit on 09/16/20 (from the past 24 hour(s))  HIV Antibody (routine testing w rflx)     Status: None   Collection Time: 09/16/20  1:42 PM  Result Value Ref Range   HIV Screen 4th Generation wRfx Non Reactive Non Reactive   Narrative   Performed at:  [address] [place] [address], [place], [place]  [place] Lab Director: [id number] MD, Phone:  [phone]  RPR     Status: None   Collection Time: 09/16/20  1:42 PM  Result Value Ref Range   RPR Ser Ql Non Reactive Non Reactive   Narrative   Performed at:  [address] [place] [address] [place], [place]  [place] Lab Director: [id number] MD, Phone:  [phone]  Hepatitis B Surface AntiGEN     Status: None   Collection Time: 09/16/20  1:42 PM  Result Value Ref Range   Hepatitis B Surface Ag Negative Negative   Narrative   Performed at:  [address] [place] [address], [place], [place]  [place] Lab Director: [id number] MD, Phone:   [phone]  Hepatitis C Antibody     Status: None   Collection Time: 09/16/20  1:42 PM  Result Value Ref Range   Hep C Virus Ab 0.1 0.0 - 0.9 s/co ratio   Narrative   Performed at:  [address] [place] [address], [place], [place]  [place] Lab Director: [id number] MD, Phone:  [phone]    No results found.   Assessment & Plan:   1. Screen for STD (sexually transmitted disease)   2. Encounter for annual routine gynecological examination      - Cytology - PAP( [place]) - HIV Antibody (routine testing w rflx) - RPR - Hepatitis B Surface AntiGEN - Hepatitis C Antibody  3. Encounter for annual routine gynecological examination  - Cytology - PAP( [place])    Approximately 20 minutes of total time was spent with this patient on counseling and coordination.   [phone], CNM 09/17/2020 6:49 AM

## 2020-09-16 NOTE — Patient Instructions (Signed)

## 2020-09-17 LAB — HEPATITIS C ANTIBODY: Hep C Virus Ab: 0.1 s/co ratio (ref 0.0–0.9)

## 2020-09-17 LAB — RPR: RPR Ser Ql: NONREACTIVE

## 2020-09-17 LAB — HEPATITIS B SURFACE ANTIGEN: Hepatitis B Surface Ag: NEGATIVE

## 2020-09-17 LAB — HIV ANTIBODY (ROUTINE TESTING W REFLEX): HIV Screen 4th Generation wRfx: NONREACTIVE

## 2020-09-21 ENCOUNTER — Telehealth: Payer: Self-pay

## 2020-09-21 NOTE — Telephone Encounter (Signed)
Called pt to advise that we need to redo Pap test due to missing info. Pt verbalized understanding, I advised some one will be calling her to reschedule Pap only.

## 2020-09-23 ENCOUNTER — Telehealth: Payer: Self-pay | Admitting: Lactation Services

## 2020-09-23 NOTE — Telephone Encounter (Signed)
Called Cytology to inquire about Pap Smear from 10/14. Tech informed me that there was a note that specimen leaked in the bag and there was no label. She reports specimen was returned to office on 10/18. Will inform [name], CNM that specimen was not run.

## 2020-09-23 NOTE — Telephone Encounter (Signed)
Called patient to let her know that her Pap Smear will need to be recollected as it leaked in the bag. K. Crisoforo Oxford would like for her to be rescheduled to repeat Pap.   Patient did not answer. LM that I was calling with results and that front office will be calling to schedule her. Message to front office to call patient to reschedule.   Will send My Chart message.

## 2020-10-13 ENCOUNTER — Ambulatory Visit (INDEPENDENT_AMBULATORY_CARE_PROVIDER_SITE_OTHER): Payer: Self-pay | Admitting: Student

## 2020-10-13 ENCOUNTER — Other Ambulatory Visit: Payer: Self-pay

## 2020-10-13 ENCOUNTER — Other Ambulatory Visit (HOSPITAL_COMMUNITY)
Admission: RE | Admit: 2020-10-13 | Discharge: 2020-10-13 | Disposition: A | Discharge status: Home / Self Care | Payer: Medicaid Other | Source: Ambulatory Visit | Attending: Student | Admitting: Student

## 2020-10-13 ENCOUNTER — Encounter: Payer: Self-pay | Admitting: Student

## 2020-10-13 VITALS — BP 116/76 | HR 83 | Wt 233.4 lb

## 2020-10-13 DIAGNOSIS — Z124 Encounter for screening for malignant neoplasm of cervix: Secondary | ICD-10-CM | POA: Diagnosis present

## 2020-10-13 NOTE — Progress Notes (Signed)
  History:  Angelica Cruz is a 24 y.o. G1P1001 who presents to clinic today for repeat pap smear. Last pap smear was not processed due to error.   The following portions of the patient's history were reviewed and updated as appropriate: allergies, current medications, family history, past medical history, social history, past surgical history and problem list.  Review of Systems:  Review of Systems  All other systems reviewed and are negative.     Objective:  Physical Exam BP 116/76   Pulse 83   Wt 233 lb 6.4 oz (105.9 kg)   LMP 10/03/2020 (Approximate)   BMI 41.34 kg/m  Physical Exam Vitals reviewed.  HENT:     Head: Normocephalic.  Genitourinary:    General: Normal vulva.     Vagina: No vaginal discharge.  Skin:    General: Skin is warm.  Neurological:     Mental Status: She is alert.       Labs and Imaging No results found for this or any previous visit (from the past 24 hour(s)).  No results found.   Assessment & Plan:  1. Papanicolaou smear for cervical cancer screening Patient here for repeat Pap and STI testing (last sample not processed due to error). No charge for today's visit.  - Cytology - PAP( Angelica Cruz)    Approximately 10 minutes of total time was spent with this patient on exam and counseling.   Angelica Cruz, CNM 10/13/2020 10:31 AM

## 2020-10-15 LAB — CYTOLOGY - PAP
Chlamydia: NEGATIVE
Comment: NEGATIVE
Comment: NEGATIVE
Comment: NORMAL
Diagnosis: NEGATIVE
Neisseria Gonorrhea: NEGATIVE
Trichomonas: POSITIVE — AB

## 2020-10-18 ENCOUNTER — Other Ambulatory Visit: Payer: Self-pay | Admitting: Lactation Services

## 2020-10-18 ENCOUNTER — Telehealth: Payer: Self-pay | Admitting: Student

## 2020-10-18 ENCOUNTER — Other Ambulatory Visit: Payer: Self-pay | Admitting: Student

## 2020-10-18 ENCOUNTER — Encounter: Payer: Self-pay | Admitting: Student

## 2020-10-18 DIAGNOSIS — A599 Trichomoniasis, unspecified: Secondary | ICD-10-CM | POA: Insufficient documentation

## 2020-10-18 MED ORDER — METRONIDAZOLE 500 MG PO TABS: 2000.0000 mg | ORAL_TABLET | Freq: Once | ORAL | 0 refills | Status: AC

## 2020-10-18 MED ORDER — METRONIDAZOLE 500 MG PO TABS: 500.0000 mg | ORAL_TABLET | Freq: Two times a day (BID) | ORAL | 0 refills | Status: DC

## 2020-10-18 NOTE — Telephone Encounter (Signed)
Called patient and confirmed identity using two identifiers. Patient aware of results (had seen on My Chart) and her partner is planning to get tested and treated. She will pick up Flagyl this afternoon; patient wanted meds sent to new pharmacy. She was advised to  abstain from intercourse until he is treated. She will make a nurse visit in one month to make sure infection is cleared.  All questions answered.   Angelica Cruz

## 2020-10-18 NOTE — Progress Notes (Signed)
Flagyl ordered per standing order for + Trich on Pap.

## 2020-11-01 ENCOUNTER — Ambulatory Visit (INDEPENDENT_AMBULATORY_CARE_PROVIDER_SITE_OTHER): Payer: Self-pay | Admitting: *Deleted

## 2020-11-01 ENCOUNTER — Telehealth: Payer: Self-pay | Admitting: *Deleted

## 2020-11-01 ENCOUNTER — Other Ambulatory Visit: Payer: Self-pay

## 2020-11-01 DIAGNOSIS — Z3200 Encounter for pregnancy test, result unknown: Secondary | ICD-10-CM

## 2020-11-01 DIAGNOSIS — Z3201 Encounter for pregnancy test, result positive: Secondary | ICD-10-CM

## 2020-11-01 LAB — POCT PREGNANCY, URINE: Preg Test, Ur: POSITIVE — AB

## 2020-11-01 NOTE — Telephone Encounter (Signed)
Agree with A & P. 

## 2020-11-01 NOTE — Telephone Encounter (Signed)
PT came in today and left urine specimen for upt. She had positive upt at home. LMP 09/28/20. Denies any vag bleeding and doing well today. Does have occ sharp pains in abd but ok today. Was dx with Trich 11/10 and states has 4 Flagyl pills to take but was afraid to take them being pregnant until asking if it was ok. Discussed with Dr Nettie Elm and he confirmed is ok for pt to take Flagyl to treat Trich. Pt instructed to take all 4 tabs at once with food and that will complete treatment. Pt plans to call office to make appt to begin [facility]. To start PNV as desired and told not to take ibuprofen but Tylenol if anything needed for pain. Pt voices understanding to above.

## 2020-11-24 ENCOUNTER — Telehealth: Payer: Self-pay | Admitting: *Deleted

## 2020-11-24 NOTE — Telephone Encounter (Signed)
Pt left VM message stating that she has a very bad headache on the Rt side of her head, "It's killing me". She believes it may be a cold and wants to know what she can take?

## 2020-11-24 NOTE — Telephone Encounter (Signed)
Returned patient's phone call and she states she believes she has a head cold because if she turns her head to the right or bends down she feels a lot of pressure in her head. Reviewed she can use tylenol, a neti pot, or nasal spray. Initially told patient sudafed was okay but confirmed that was only in her 2nd trimester and she is still a little early for that. Also suggested trying some of the allergy medicines, breathe right strips, or vicks vapor rub. Patient verbalized understanding.

## 2020-12-04 NOTE — L&D Delivery Note (Signed)
OB/GYN Faculty Practice Delivery Note  Angelica Cruz is a 25 y.o. [id number] s/p NSVD at [redacted]w[redacted]d. She was admitted for SOL.   ROM: 0h 55m with clear fluid GBS Status: Positive  Delivery Date/Time: 07/04/21 at 1216  Delivery: Called to room and patient was complete and pushing with bulging bag. Artificial ROM performed. Head delivered LOA. No nuchal cord present. Shoulder and body delivered in usual fashion. Infant with spontaneous cry, placed on mother's abdomen, dried and stimulated. Cord clamped x 2 after 1-minute delay, and cut by father of baby under my direct supervision. Cord blood drawn. Placenta delivered spontaneously with gentle cord traction. Fundus firm with massage and Pitocin. Labia, perineum, vagina, and cervix were inspected. Small vaginal abrasion noted at the introitus that was hemostatic and not repaired.  Placenta: 3 vessel cord, intact Complications: None Lacerations: Vaginal abrasion; hemostatic and not repaired EBL: 100 cc Analgesia: None  Infant: Viable female  APGARs 9/9  3351 g  Evalina Field, MD  Obstetrics Fellow 07/04/21 2:18 PM

## 2020-12-13 ENCOUNTER — Encounter: Payer: Self-pay | Admitting: *Deleted

## 2020-12-13 ENCOUNTER — Telehealth (INDEPENDENT_AMBULATORY_CARE_PROVIDER_SITE_OTHER): Payer: Self-pay | Admitting: *Deleted

## 2020-12-13 ENCOUNTER — Other Ambulatory Visit: Payer: Self-pay

## 2020-12-13 DIAGNOSIS — O26899 Other specified pregnancy related conditions, unspecified trimester: Secondary | ICD-10-CM

## 2020-12-13 DIAGNOSIS — R102 Pelvic and perineal pain: Secondary | ICD-10-CM

## 2020-12-13 DIAGNOSIS — M549 Dorsalgia, unspecified: Secondary | ICD-10-CM

## 2020-12-13 DIAGNOSIS — R059 Cough, unspecified: Secondary | ICD-10-CM

## 2020-12-13 DIAGNOSIS — O99891 Other specified diseases and conditions complicating pregnancy: Secondary | ICD-10-CM

## 2020-12-13 DIAGNOSIS — O99210 Obesity complicating pregnancy, unspecified trimester: Secondary | ICD-10-CM | POA: Insufficient documentation

## 2020-12-13 DIAGNOSIS — Z3A00 Weeks of gestation of pregnancy not specified: Secondary | ICD-10-CM

## 2020-12-13 DIAGNOSIS — Z3490 Encounter for supervision of normal pregnancy, unspecified, unspecified trimester: Secondary | ICD-10-CM

## 2020-12-13 NOTE — Patient Instructions (Addendum)
- At our [facility], we work as an integrated team, providing care to address both physical and emotional health. Your medical provider may refer you to see our [facility]) on the same day you see your medical provider, as availability permits; often scheduled virtually at your convenience.  Our [facility] is available to all patients, visits generally last between 20-30 minutes, but can be longer or shorter, depending on patient need. The [facility] offers help with stress management, coping with symptoms of depression and anxiety, major life changes , sleep issues, changing risky behavior, grief and loss, life stress, working on personal life goals, and  behavioral health issues, as these all affect your overall health and wellness.  The [facility] is NOT available for the following: FMLA paperwork, court-ordered evaluations, specialty assessments (custody or disability), letters to employers, or obtaining certification for an emotional support animal. The [facility] does not provide long-term therapy. You have the right to refuse integrated behavioral health services, or to reschedule to see the [facility] at a later date.  Confidentiality exception: If it is suspected that a child or disabled adult is being abused or neglected, we are required by law to report that to either Child Protective Services or Adult Management consultantrotective Services.  If you have a diagnosis of Bipolar affective disorder, Schizophrenia, or recurrent Major depressive disorder, we will recommend that you establish care with a psychiatrist, as these are lifelong, chronic conditions, and we want your overall emotional health and medications to be more closely monitored. If you anticipate needing extended maternity leave due to mental health issues postpartum, it it recommended you inform your medical provider, so we can put in a referral to a  psychiatrist as soon as possible. The [facility] is unable to recommend an extended maternity leave for mental  health issues. Your medical provider or [facility] may refer you to a therapist for ongoing, traditional therapy, or to a psychiatrist, for medication management, if it would benefit your overall health. Depending on your insurance, you may have a copay to see the [facility]. If you are uninsured, it is recommended that you apply for financial assistance. (Forms may be requested at the front desk for in-person visits, via MyChart, or request a form during a virtual visit).  If you see the [facility] more than 6 times, you will have to complete a comprehensive clinical assessment interview with the [facility] to resume integrated services.  For virtual visits with the [facility], you must be physically in the state of [place] at the time of the visit. For example, if you live in [place], you will have to do an in-person visit with the [facility], and your out-of-state insurance may not cover behavioral health services in [place]. f you are going out of the state or country for any reason, the [facility] may see you virtually when you return to [place], but not while you are physically outside of [place]  [email or web address]   For covid testing   Eating Plan for Pregnant Women While you are pregnant, your body requires additional nutrition to help support your growing baby. You also have a higher need for some vitamins and minerals, such as folic acid, calcium, iron, and vitamin D. Eating a healthy, well-balanced diet is very important for your health and your baby's health. Your need for extra calories varies over the course of your pregnancy. Pregnancy is divided into three trimesters, with each trimester lasting 3 months. For most women, it is recommended to  consume:  150 extra calories a day during the first trimester.  300 extra calories a day during the second trimester.  300 extra calories a day during the third trimester. What are tips for following this plan? Cooking  Practice good food safety and  cleanliness. Wash your hands before you eat and after you prepare raw meat. Wash all fruits and vegetables well before peeling or eating. Taking these actions can help to prevent foodborne illnesses that can be very dangerous to your baby, such as listeriosis. Ask your health care provider for more information about listeriosis.  Make sure that all meats, poultry, and eggs are cooked to food-safe temperatures or "well-done." Meal planning  Eat a variety of foods (especially fruits and vegetables) to get a full range of vitamins and minerals.  Two or more servings of fish are recommended each week in order to get the most benefits from omega-3 fatty acids that are found in seafood. Choose fish that are lower in mercury, such as salmon and pollock.  Limit your overall intake of foods that have "empty calories." These are foods that have little nutritional value, such as sweets, desserts, candies, and sugar-sweetened beverages.  Drinks that contain caffeine are okay to drink, but it is better to avoid caffeine. Keep your total caffeine intake to less than 200 mg each day (which is 12 oz or 355 mL of coffee, tea, or soda) or the limit as told by your health care provider.   General information  Do not try to lose weight or go on a diet during pregnancy.  Take a prenatal vitamin to help meet your additional vitamin and mineral needs during pregnancy, specifically for folic acid, iron, calcium, and vitamin D.  Remember to stay active. Ask your health care provider what types of exercise and activities are safe for you. What does 150 extra calories look like? Healthy options that provide 150 extra calories each day could be any of the following:  6-8 oz (170-227 g) plain low-fat yogurt with  cup (70 g) berries.  1 apple with 2 tsp (11 g) peanut butter.  Cut-up vegetables with  cup (60 g) hummus.  8 fl oz (237 mL) low-fat chocolate milk.  1 stick of string cheese with 1 medium orange.  1  peanut butter and jelly sandwich that is made with one slice of whole-wheat bread and 1 tsp (5 g) of peanut butter. For 300 extra calories, you could eat two of these healthy options each day. What is a healthy amount of weight to gain? The right amount of weight gain for you is based on your BMI (body mass index) before you became pregnant.  If your BMI was less than 18 (underweight), you should gain 28-40 lb (13-18 kg).  If your BMI was 18-24.9 (normal), you should gain 25-35 lb (11-16 kg).  If your BMI was 25-29.9 (overweight), you should gain 15-25 lb (7-11 kg).  If your BMI was 30 or greater (obese), you should gain 11-20 lb (5-9 kg). What if I am having twins or multiples? Generally, if you are carrying twins or multiples:  You may need to eat 300-600 extra calories a day.  The recommended range for total weight gain is 25-54 lb (11-25 kg), depending on your BMI before pregnancy.  Talk with your health care provider to find out about nutritional needs, weight gain, and exercise that is right for you. What foods should I eat? Fruits All fruits. Eat a variety of colors and types  of fruit. Remember to wash your fruits well before peeling or eating. Vegetables All vegetables. Eat a variety of colors and types of vegetables. Remember to wash your vegetables well before peeling or eating. Grains All grains. Choose whole grains, such as whole-wheat bread, oatmeal, or brown rice. Meats and other protein foods Lean meats, including chicken, [place], and lean cuts of beef, veal, or pork. Fish that is higher in omega-3 fatty acids and lower in mercury, such as salmon, herring, mussels, trout, sardines, pollock, shrimp, crab, and lobster. Tofu. Tempeh. Beans. Eggs. Peanut butter and other nut butters. Dairy Pasteurized milk and milk alternatives, such as almond milk. Pasteurized yogurt and pasteurized cheese. Cottage cheese. Sour cream. Beverages Water. Juices that contain 100% fruit juice  or vegetable juice. Caffeine-free teas and decaffeinated coffee. Fats and oils Fats and oils are okay to include in moderation. Sweets and desserts Sweets and desserts are okay to include in moderation. Seasoning and other foods All pasteurized condiments. The items listed above may not be a complete list of foods and beverages you can eat. Contact a dietitian for more information.   What foods should I avoid? Fruits Raw (unpasteurized) fruit juices. Vegetables Unpasteurized vegetable juices. Meats and other protein foods Precooked or cured meat, such as bologna, hot dogs, sausages, or meat loaves. (If you must eat those meats, reheat them until they are steaming hot.) Refrigerated pate, meat spreads from a meat counter, or smoked seafood that is found in the refrigerated section of a store. Raw or undercooked meats, poultry, and eggs. Raw fish, such as sushi or sashimi. Fish that have high mercury content, such as tilefish, shark, swordfish, and king mackerel. Dairy Unpasteurized milk and any foods that have unpasteurized milk in them. Soft cheeses, such as feta, queso blanco, queso fresco, [place], [place], panela, and blue-veined cheeses (unless they are made with pasteurized milk, which must be stated on the label). Beverages Alcohol. Sugar-sweetened beverages, such as sodas, teas, or energy drinks. Seasoning and other foods Homemade fermented foods and drinks, such as pickles, sauerkraut, or kombucha drinks. (Store-bought pasteurized versions of these are okay.) Salads that are made in a store or deli, such as ham salad, chicken salad, egg salad, tuna salad, and seafood salad. The items listed above may not be a complete list of foods and beverages you should avoid. Contact a dietitian for more information. Where to find more information To calculate the number of calories you need based on your height, weight, and activity level, you can use an online calculator such  as:  PayStrike.dk To calculate how much weight you should gain during pregnancy, you can use an online pregnancy weight gain calculator such as:  http://www.harvey.com/ To learn more about eating fish during pregnancy, talk with your health care provider or visit:  PumpkinSearch.com.ee Summary  While you are pregnant, your body requires additional nutrition to help support your growing baby.  Eat a variety of foods, especially fruits and vegetables, to get a full range of vitamins and minerals.  Practice good food safety and cleanliness. Wash your hands before you eat and after you prepare raw meat. Wash all fruits and vegetables well before peeling or eating. Taking these actions can help to prevent foodborne illnesses, such as listeriosis, that can be very dangerous to your baby.  Do not eat raw meat or fish. Do not eat fish that have high mercury content, such as tilefish, shark, swordfish, and king mackerel. Do not eat raw (unpasteurized) dairy.  Take a prenatal vitamin  to help meet your additional vitamin and mineral needs during pregnancy, specifically for folic acid, iron, calcium, and vitamin D. This information is not intended to replace advice given to you by your health care provider. Make sure you discuss any questions you have with your health care provider. Document Revised: 06/17/2020 Document Reviewed: 06/17/2020 Elsevier Patient Education  2021 [employer].

## 2020-12-13 NOTE — Progress Notes (Signed)
1:04 I sent a MyChart message to invite Angelica Cruz to join virtual visit. Angelica Ismael,RN 1:06 Angelica Cruz not connected virtually. I called Angelica Cruz and she confirmed she is trying to join virtually but receiving error message. I asked if she has an Iphone ; she confirmed she does. I asked her to update to latest IOS and then try to join virtually; and if that doesn't work we will do visit by telephone. Angelica Perkin,RN  New OB Intake  I connected with  Angelica Cruz on 12/13/20 at 1:25 by MyChart and verified that I am speaking with the correct person using two identifiers. Nurse is located at [facility] and pt is located at Home.  Is completing visit via computer MyChart since phone is still updating.   I discussed the limitations, risks, security and privacy concerns of performing an evaluation and management service by telephone and the availability of in person appointments. I also discussed with the patient that there may be a patient responsible charge related to this service. The patient expressed understanding and agreed to proceed.  I explained I am completing New OB Intake today. We discussed her EDD of 07/05/2021 that is based on LMP of 09/28/20. Pt is G2/P1001. I reviewed her allergies, medications, Medical/Surgical/OB history, and appropriate screenings. I informed her of [facility] services. Based on history, this is a/an uncomplicated pregnancy.  Concerns addressed today  During visit noted patient has a cough. Advised to get covid tested. If + to call our office.   Delivery Plans Plans to deliver [facility].  MyChart/Babyscripts MyChart access verified. I explained pt will have some visits in office and some virtually. Babyscripts instructions given. Account successfully created and app downloaded.  Blood Pressure Cuff Angelica Cruz does not have a blood pressure cuff. She has pregnancy medicaid . We discussed we will give her a cuff at her new ob visit.  Explained after first prenatal appt pt will check weekly and  document in Babyscripts.  Anatomy US Explained first scheduled [place] will be around 19 weeks. Anatomy US scheduled for 02/07/21 at 0815. Pt notified to arrive at 0800.  Labs Discussed Angelica Cruz genetic screening with patient. Would like  Panorama  drawn at new OB visit. Routine prenatal labs needed.  WIC Is interested in a referral   COVID Vaccine Has not had covid vaccine, interested in information about that.   Patient c/o back pain , pelvic pain in pregnancy. Denies vaginal bleeding. Advised may take tylenol and discussed pregnancy restrictions. Advised if she has severe pain or vaginal bleeding to go to [facility] for evaluation.  Will place pregnancy restrictions letter in her MyChart.   First visit review I reviewed new OB appt with pt. I explained she will have a pelvic exam, ob bloodwork with genetic screening. Explained pt will be seen by Mathews Robinsons at first visit; encounter routed to appropriate provider.  Angelica Stead,RN 12/13/2020  1:11 PM

## 2020-12-17 ENCOUNTER — Telehealth: Payer: Self-pay

## 2020-12-17 NOTE — Telephone Encounter (Signed)
Pt called stating that she was advised to get a COVID test due to her coughing and she does not know if the results will be back in time before her appt she has scheduled.  Left message for pt that I am returning her call and responding to her MyChart message that she left.  Our office is currently closed and will not reopen until Tuesday however I will send her information through MyChart and to know that her appt date will not change.  MyChart message sent.   Addison Naegeli, RN  12/17/20

## 2020-12-21 ENCOUNTER — Encounter: Payer: Medicaid Other | Admitting: Student

## 2021-01-04 ENCOUNTER — Ambulatory Visit (INDEPENDENT_AMBULATORY_CARE_PROVIDER_SITE_OTHER): Payer: Medicaid Other | Admitting: Family Medicine

## 2021-01-04 ENCOUNTER — Other Ambulatory Visit: Payer: Self-pay

## 2021-01-04 ENCOUNTER — Encounter: Payer: Self-pay | Admitting: *Deleted

## 2021-01-04 ENCOUNTER — Encounter: Payer: Self-pay | Admitting: Family Medicine

## 2021-01-04 DIAGNOSIS — Z3490 Encounter for supervision of normal pregnancy, unspecified, unspecified trimester: Secondary | ICD-10-CM

## 2021-01-04 DIAGNOSIS — Z23 Encounter for immunization: Secondary | ICD-10-CM

## 2021-01-04 NOTE — Progress Notes (Signed)
Subjective:   Angelica Cruz is a 25 y.o. G2P1001 at [redacted]w[redacted]d by LMP being seen today for her first obstetrical visit.  Her obstetrical history is significant for obesity. Patient does intend to breast feed. Pregnancy history fully reviewed.  Patient reports backache.   HISTORY: OB History  Gravida Para Term Preterm AB Living  2 1 1  0 0 1  SAB IAB Ectopic Multiple Live Births  0 0 0 0 1    # Outcome Date GA Lbr Len/2nd Weight Sex Delivery Anes PTL Lv  2 Current           1 Term 03/05/18 [redacted]w[redacted]d 04:48 / 00:12 6 lb 10 oz (3.005 kg) F Vag-Spont None  LIV     Name: Angelica, Cruz     Apgar1: 8  Apgar5: 9   Last pap smear was  10/13/2020 and was normal Past Medical History:  Diagnosis Date  . Chlamydia   . Medical history non-contributory    Past Surgical History:  Procedure Laterality Date  . NO PAST SURGERIES     Family History  Problem Relation Age of Onset  . Hypertension Mother    Social History   Tobacco Use  . Smoking status: Never Smoker  . Smokeless tobacco: Never Used  Vaping Use  . Vaping Use: Never used  Substance Use Topics  . Alcohol use: Not Currently    Comment: occasionally  . Drug use: No   No Known Allergies Current Outpatient Medications on File Prior to Visit  Medication Sig Dispense Refill  . acetaminophen (TYLENOL) 325 MG tablet Take 650 mg by mouth every 6 (six) hours as needed.    . Prenatal Vit-Fe Fumarate-FA (PRENATAL VITAMINS PO) Take 1 tablet by mouth daily.     No current facility-administered medications on file prior to visit.     Exam   Vitals:   01/04/21 1032  BP: (!) 110/55  Pulse: 83  Weight: 229 lb 11.2 oz (104.2 kg)   Fetal Heart Rate (bpm): 152  Uterus:     Pelvic Exam: Perineum: no hemorrhoids, normal perineum   Vulva: normal external genitalia, no lesions   Vagina:  normal mucosa, normal discharge   Cervix: no lesions and normal, pap smear done.    Adnexa: normal adnexa and no mass, fullness,  tenderness   Bony Pelvis: average  System: General: well-developed, well-nourished female in no acute distress   Breast:  normal appearance, no masses or tenderness   Skin: normal coloration and turgor, no rashes   Neurologic: oriented, normal, negative, normal mood   Extremities: normal strength, tone, and muscle mass, ROM of all joints is normal   HEENT PERRLA, extraocular movement intact and sclera clear, anicteric   Mouth/Teeth mucous membranes moist, pharynx normal without lesions and dental hygiene good   Neck supple and no masses   Cardiovascular: regular rate and rhythm   Respiratory:  no respiratory distress, normal breath sounds   Abdomen: soft, non-tender; bowel sounds normal; no masses,  no organomegaly     Assessment:   Pregnancy: G2P1001 Patient Active Problem List   Diagnosis Date Noted  . Supervision of low-risk pregnancy 12/13/2020  . Obesity during pregnancy 12/13/2020  . Trichomoniasis 10/18/2020  . Chlamydia infection affecting pregnancy 09/01/2017     Plan:  1. Encounter for supervision of low-risk pregnancy, antepartum Sleeping position discussed for back pain Initial labs drawn. Continue prenatal vitamins. Genetic Screening discussed, NIPS: ordered. Ultrasound discussed; fetal anatomic survey: ordered. Problem list reviewed  and updated. The nature of [facility] with multiple MDs and other Advanced Practice Providers was explained to patient; also emphasized that residents, students are part of our team. Routine obstetric precautions reviewed. Return in 4 weeks (on 02/01/2021) for [facility], ob visit.

## 2021-01-04 NOTE — Progress Notes (Signed)
Able to draw labs patient dehydrated I stick twice and Toniann Fail tried as well. Advised patient to drink plenty of water and return in two days for lab only appointment.

## 2021-01-04 NOTE — Patient Instructions (Signed)
 Second Trimester of Pregnancy  The second trimester of pregnancy is from week 13 through week 27. This is months 4 through 6 of pregnancy. The second trimester is often a time when you feel your best. Your body has adjusted to being pregnant, and you begin to feel better physically. During the second trimester:  Morning sickness has lessened or stopped completely.  You may have more energy.  You may have an increase in appetite. The second trimester is also a time when the unborn baby (fetus) is growing rapidly. At the end of the sixth month, the fetus may be up to 12 inches long and weigh about 1 pounds. You will likely begin to feel the baby move (quickening) between 16 and 20 weeks of pregnancy. Body changes during your second trimester Your body continues to go through many changes during your second trimester. The changes vary and generally return to normal after the baby is born. Physical changes  Your weight will continue to increase. You will notice your lower abdomen bulging out.  You may begin to get stretch marks on your hips, abdomen, and breasts.  Your breasts will continue to grow and to become tender.  Dark spots or blotches (chloasma or mask of pregnancy) may develop on your face.  A dark line from your belly button to the pubic area (linea nigra) may appear.  You may have changes in your hair. These can include thickening of your hair, rapid growth, and changes in texture. Some people also have hair loss during or after pregnancy, or hair that feels dry or thin. Health changes  You may develop headaches.  You may have heartburn.  You may develop constipation.  You may develop hemorrhoids or swollen, bulging veins (varicose veins).  Your gums may bleed and may be sensitive to brushing and flossing.  You may urinate more often because the fetus is pressing on your bladder.  You may have back pain. This is caused by: ? Weight gain. ? Pregnancy hormones  that are relaxing the joints in your pelvis. ? A shift in weight and the muscles that support your balance. Follow these instructions at home: Medicines  Follow your health care provider's instructions regarding medicine use. Specific medicines may be either safe or unsafe to take during pregnancy. Do not take any medicines unless approved by your health care provider.  Take a prenatal vitamin that contains at least 600 micrograms (mcg) of folic acid. Eating and drinking  Eat a healthy diet that includes fresh fruits and vegetables, whole grains, good sources of protein such as meat, eggs, or tofu, and low-fat dairy products.  Avoid raw meat and unpasteurized juice, milk, and cheese. These carry germs that can harm you and your baby.  You may need to take these actions to prevent or treat constipation: ? Drink enough fluid to keep your urine pale yellow. ? Eat foods that are high in fiber, such as beans, whole grains, and fresh fruits and vegetables. ? Limit foods that are high in fat and processed sugars, such as fried or sweet foods. Activity  Exercise only as directed by your health care provider. Most people can continue their usual exercise routine during pregnancy. Try to exercise for 30 minutes at least 5 days a week. Stop exercising if you develop contractions in your uterus.  Stop exercising if you develop pain or cramping in the lower abdomen or lower back.  Avoid exercising if it is very hot or humid or if you are   at a high altitude.  Avoid heavy lifting.  If you choose to, you may have sex unless your health care provider tells you not to. Relieving pain and discomfort  Wear a supportive bra to prevent discomfort from breast tenderness.  Take warm sitz baths to soothe any pain or discomfort caused by hemorrhoids. Use hemorrhoid cream if your health care provider approves.  Rest with your legs raised (elevated) if you have leg cramps or low back pain.  If you develop  varicose veins: ? Wear support hose as told by your health care provider. ? Elevate your feet for 15 minutes, 3-4 times a day. ? Limit salt in your diet. Safety  Wear your seat belt at all times when driving or riding in a car.  Talk with your health care provider if someone is verbally or physically abusive to you. Lifestyle  Do not use hot tubs, steam rooms, or saunas.  Do not douche. Do not use tampons or scented sanitary pads.  Avoid cat litter boxes and soil used by cats. These carry germs that can cause birth defects in the baby and possibly loss of the fetus by miscarriage or stillbirth.  Do not use herbal remedies, alcohol, illegal drugs, or medicines that are not approved by your health care provider. Chemicals in these products can harm your baby.  Do not use any products that contain nicotine or tobacco, such as cigarettes, e-cigarettes, and chewing tobacco. If you need help quitting, ask your health care provider. General instructions  During a routine prenatal visit, your health care provider will do a physical exam and other tests. He or she will also discuss your overall health. Keep all follow-up visits. This is important.  Ask your health care provider for a referral to a local prenatal education class.  Ask for help if you have counseling or nutritional needs during pregnancy. Your health care provider can offer advice or refer you to specialists for help with various needs. Where to find more information  American Pregnancy Association: americanpregnancy.org  American College of Obstetricians and Gynecologists: acog.org/en/Womens%20Health/Pregnancy  Office on Women's Health: womenshealth.gov/pregnancy Contact a health care provider if you have:  A headache that does not go away when you take medicine.  Vision changes or you see spots in front of your eyes.  Mild pelvic cramps, pelvic pressure, or nagging pain in the abdominal area.  Persistent nausea,  vomiting, or diarrhea.  A bad-smelling vaginal discharge or foul-smelling urine.  Pain when you urinate.  Sudden or extreme swelling of your face, hands, ankles, feet, or legs.  A fever. Get help right away if you:  Have fluid leaking from your vagina.  Have spotting or bleeding from your vagina.  Have severe abdominal cramping or pain.  Have difficulty breathing.  Have chest pain.  Have fainting spells.  Have not felt your baby move for the time period told by your health care provider.  Have new or increased pain, swelling, or redness in an arm or leg. Summary  The second trimester of pregnancy is from week 13 through week 27 (months 4 through 6).  Do not use herbal remedies, alcohol, illegal drugs, or medicines that are not approved by your health care provider. Chemicals in these products can harm your baby.  Exercise only as directed by your health care provider. Most people can continue their usual exercise routine during pregnancy.  Keep all follow-up visits. This is important. This information is not intended to replace advice given to you by   your health care provider. Make sure you discuss any questions you have with your health care provider. Document Revised: 04/28/2020 Document Reviewed: 03/04/2020 Elsevier Patient Education  2021 [employer]   Contraception Choices Contraception, also called birth control, refers to methods or devices that prevent pregnancy. Hormonal methods Contraceptive implant A contraceptive implant is a thin, plastic tube that contains a hormone that prevents pregnancy. It is different from an intrauterine device (IUD). It is inserted into the upper part of the arm by a health care provider. Implants can be effective for up to 3 years. Progestin-only injections Progestin-only injections are injections of progestin, a synthetic form of the hormone progesterone. They are given every 3 months by a health care provider. Birth control  pills Birth control pills are pills that contain hormones that prevent pregnancy. They must be taken once a day, preferably at the same time each day. A prescription is needed to use this method of contraception. Birth control patch The birth control patch contains hormones that prevent pregnancy. It is placed on the skin and must be changed once a week for three weeks and removed on the fourth week. A prescription is needed to use this method of contraception. Vaginal ring A vaginal ring contains hormones that prevent pregnancy. It is placed in the vagina for three weeks and removed on the fourth week. After that, the process is repeated with a new ring. A prescription is needed to use this method of contraception. Emergency contraceptive Emergency contraceptives prevent pregnancy after unprotected sex. They come in pill form and can be taken up to 5 days after sex. They work best the sooner they are taken after having sex. Most emergency contraceptives are available without a prescription. This method should not be used as your only form of birth control.   Barrier methods Female condom A female condom is a thin sheath that is worn over the penis during sex. Condoms keep sperm from going inside a woman's body. They can be used with a sperm-killing substance (spermicide) to increase their effectiveness. They should be thrown away after one use. Female condom A female condom is a soft, loose-fitting sheath that is put into the vagina before sex. The condom keeps sperm from going inside a woman's body. They should be thrown away after one use. Diaphragm A diaphragm is a soft, dome-shaped barrier. It is inserted into the vagina before sex, along with a spermicide. The diaphragm blocks sperm from entering the uterus, and the spermicide kills sperm. A diaphragm should be left in the vagina for 6-8 hours after sex and removed within 24 hours. A diaphragm is prescribed and fitted by a health care provider. A  diaphragm should be replaced every 1-2 years, after giving birth, after gaining more than 15 lb (6.8 kg), and after pelvic surgery. Cervical cap A cervical cap is a round, soft latex or plastic cup that fits over the cervix. It is inserted into the vagina before sex, along with spermicide. It blocks sperm from entering the uterus. The cap should be left in place for 6-8 hours after sex and removed within 48 hours. A cervical cap must be prescribed and fitted by a health care provider. It should be replaced every 2 years. Sponge A sponge is a soft, circular piece of polyurethane foam with spermicide in it. The sponge helps block sperm from entering the uterus, and the spermicide kills sperm. To use it, you make it wet and then insert it into the vagina. It should be   inserted before sex, left in for at least 6 hours after sex, and removed and thrown away within 30 hours. Spermicides Spermicides are chemicals that kill or block sperm from entering the cervix and uterus. They can come as a cream, jelly, suppository, foam, or tablet. A spermicide should be inserted into the vagina with an applicator at least 10-15 minutes before sex to allow time for it to work. The process must be repeated every time you have sex. Spermicides do not require a prescription.   Intrauterine contraception Intrauterine device (IUD) An IUD is a T-shaped device that is put in a woman's uterus. There are two types:  Hormone IUD.This type contains progestin, a synthetic form of the hormone progesterone. This type can stay in place for 3-5 years.  Copper IUD.This type is wrapped in copper wire. It can stay in place for 10 years. Permanent methods of contraception Female tubal ligation In this method, a woman's fallopian tubes are sealed, tied, or blocked during surgery to prevent eggs from traveling to the uterus. Hysteroscopic sterilization In this method, a small, flexible insert is placed into each fallopian tube. The inserts  cause scar tissue to form in the fallopian tubes and block them, so sperm cannot reach an egg. The procedure takes about 3 months to be effective. Another form of birth control must be used during those 3 months. Female sterilization This is a procedure to tie off the tubes that carry sperm (vasectomy). After the procedure, the man can still ejaculate fluid (semen). Another form of birth control must be used for 3 months after the procedure. Natural planning methods Natural family planning In this method, a couple does not have sex on days when the woman could become pregnant. Calendar method In this method, the woman keeps track of the length of each menstrual cycle, identifies the days when pregnancy can happen, and does not have sex on those days. Ovulation method In this method, a couple avoids sex during ovulation. Symptothermal method This method involves not having sex during ovulation. The woman typically checks for ovulation by watching changes in her temperature and in the consistency of cervical mucus. Post-ovulation method In this method, a couple waits to have sex until after ovulation. Where to find more information  Centers for Disease Control and Prevention: www.cdc.gov Summary  Contraception, also called birth control, refers to methods or devices that prevent pregnancy.  Hormonal methods of contraception include implants, injections, pills, patches, vaginal rings, and emergency contraceptives.  Barrier methods of contraception can include female condoms, female condoms, diaphragms, cervical caps, sponges, and spermicides.  There are two types of IUDs (intrauterine devices). An IUD can be put in a woman's uterus to prevent pregnancy for 3-5 years.  Permanent sterilization can be done through a procedure for males and females. Natural family planning methods involve nothaving sex on days when the woman could become pregnant. This information is not intended to replace advice  given to you by your health care provider. Make sure you discuss any questions you have with your health care provider. Document Revised: 04/26/2020 Document Reviewed: 04/26/2020 Elsevier Patient Education  2021 [employer] Inc.   Breastfeeding  Choosing to breastfeed is one of the best decisions you can make for yourself and your baby. A change in hormones during pregnancy causes your breasts to make breast milk in your milk-producing glands. Hormones prevent breast milk from being released before your baby is born. They also prompt milk flow after birth. Once breastfeeding has begun, thoughts of   your baby, as well as his or her sucking or crying, can stimulate the release of milk from your milk-producing glands. Benefits of breastfeeding Research shows that breastfeeding offers many health benefits for infants and mothers. It also offers a cost-free and convenient way to feed your baby. For your baby  Your first milk (colostrum) helps your baby's digestive system to function better.  Special cells in your milk (antibodies) help your baby to fight off infections.  Breastfed babies are less likely to develop asthma, allergies, obesity, or type 2 diabetes. They are also at lower risk for sudden infant death syndrome (SIDS).  Nutrients in breast milk are better able to meet your baby's needs compared to infant formula.  Breast milk improves your baby's brain development. For you  Breastfeeding helps to create a very special bond between you and your baby.  Breastfeeding is convenient. Breast milk costs nothing and is always available at the correct temperature.  Breastfeeding helps to burn calories. It helps you to lose the weight that you gained during pregnancy.  Breastfeeding makes your uterus return faster to its size before pregnancy. It also slows bleeding (lochia) after you give birth.  Breastfeeding helps to lower your risk of developing type 2 diabetes, osteoporosis, rheumatoid  arthritis, cardiovascular disease, and breast, ovarian, uterine, and endometrial cancer later in life. Breastfeeding basics Starting breastfeeding  Find a comfortable place to sit or lie down, with your neck and back well-supported.  Place a pillow or a rolled-up blanket under your baby to bring him or her to the level of your breast (if you are seated). Nursing pillows are specially designed to help support your arms and your baby while you breastfeed.  Make sure that your baby's tummy (abdomen) is facing your abdomen.  Gently massage your breast. With your fingertips, massage from the outer edges of your breast inward toward the nipple. This encourages milk flow. If your milk flows slowly, you may need to continue this action during the feeding.  Support your breast with 4 fingers underneath and your thumb above your nipple (make the letter "C" with your hand). Make sure your fingers are well away from your nipple and your baby's mouth.  Stroke your baby's lips gently with your finger or nipple.  When your baby's mouth is open wide enough, quickly bring your baby to your breast, placing your entire nipple and as much of the areola as possible into your baby's mouth. The areola is the colored area around your nipple. ? More areola should be visible above your baby's upper lip than below the lower lip. ? Your baby's lips should be opened and extended outward (flanged) to ensure an adequate, comfortable latch. ? Your baby's tongue should be between his or her lower gum and your breast.  Make sure that your baby's mouth is correctly positioned around your nipple (latched). Your baby's lips should create a seal on your breast and be turned out (everted).  It is common for your baby to suck about 2-3 minutes in order to start the flow of breast milk. Latching Teaching your baby how to latch onto your breast properly is very important. An improper latch can cause nipple pain, decreased milk  supply, and poor weight gain in your baby. Also, if your baby is not latched onto your nipple properly, he or she may swallow some air during feeding. This can make your baby fussy. Burping your baby when you switch breasts during the feeding can help to get rid   of the air. However, teaching your baby to latch on properly is still the best way to prevent fussiness from swallowing air while breastfeeding. Signs that your baby has successfully latched onto your nipple  Silent tugging or silent sucking, without causing you pain. Infant's lips should be extended outward (flanged).  Swallowing heard between every 3-4 sucks once your milk has started to flow (after your let-down milk reflex occurs).  Muscle movement above and in front of his or her ears while sucking. Signs that your baby has not successfully latched onto your nipple  Sucking sounds or smacking sounds from your baby while breastfeeding.  Nipple pain. If you think your baby has not latched on correctly, slip your finger into the corner of your baby's mouth to break the suction and place it between your baby's gums. Attempt to start breastfeeding again. Signs of successful breastfeeding Signs from your baby  Your baby will gradually decrease the number of sucks or will completely stop sucking.  Your baby will fall asleep.  Your baby's body will relax.  Your baby will retain a small amount of milk in his or her mouth.  Your baby will let go of your breast by himself or herself. Signs from you  Breasts that have increased in firmness, weight, and size 1-3 hours after feeding.  Breasts that are softer immediately after breastfeeding.  Increased milk volume, as well as a change in milk consistency and color by the fifth day of breastfeeding.  Nipples that are not sore, cracked, or bleeding. Signs that your baby is getting enough milk  Wetting at least 1-2 diapers during the first 24 hours after birth.  Wetting at least 5-6  diapers every 24 hours for the first week after birth. The urine should be clear or pale yellow by the age of 5 days.  Wetting 6-8 diapers every 24 hours as your baby continues to grow and develop.  At least 3 stools in a 24-hour period by the age of 5 days. The stool should be soft and yellow.  At least 3 stools in a 24-hour period by the age of 7 days. The stool should be seedy and yellow.  No loss of weight greater than 10% of birth weight during the first 3 days of life.  Average weight gain of 4-7 oz (113-198 g) per week after the age of 4 days.  Consistent daily weight gain by the age of 5 days, without weight loss after the age of 2 weeks. After a feeding, your baby may spit up a small amount of milk. This is normal. Breastfeeding frequency and duration Frequent feeding will help you make more milk and can prevent sore nipples and extremely full breasts (breast engorgement). Breastfeed when you feel the need to reduce the fullness of your breasts or when your baby shows signs of hunger. This is called "breastfeeding on demand." Signs that your baby is hungry include:  Increased alertness, activity, or restlessness.  Movement of the head from side to side.  Opening of the mouth when the corner of the mouth or cheek is stroked (rooting).  Increased sucking sounds, smacking lips, cooing, sighing, or squeaking.  Hand-to-mouth movements and sucking on fingers or hands.  Fussing or crying. Avoid introducing a pacifier to your baby in the first 4-6 weeks after your baby is born. After this time, you may choose to use a pacifier. Research has shown that pacifier use during the first year of a baby's life decreases the risk of   sudden infant death syndrome (SIDS). Allow your baby to feed on each breast as long as he or she wants. When your baby unlatches or falls asleep while feeding from the first breast, offer the second breast. Because newborns are often sleepy in the first few weeks of  life, you may need to awaken your baby to get him or her to feed. Breastfeeding times will vary from baby to baby. However, the following rules can serve as a guide to help you make sure that your baby is properly fed:  Newborns (babies 4 weeks of age or younger) may breastfeed every 1-3 hours.  Newborns should not go without breastfeeding for longer than 3 hours during the day or 5 hours during the night.  You should breastfeed your baby a minimum of 8 times in a 24-hour period. Breast milk pumping Pumping and storing breast milk allows you to make sure that your baby is exclusively fed your breast milk, even at times when you are unable to breastfeed. This is especially important if you go back to work while you are still breastfeeding, or if you are not able to be present during feedings. Your lactation consultant can help you find a method of pumping that works best for you and give you guidelines about how long it is safe to store breast milk.      Caring for your breasts while you breastfeed Nipples can become dry, cracked, and sore while breastfeeding. The following recommendations can help keep your breasts moisturized and healthy:  Avoid using soap on your nipples.  Wear a supportive bra designed especially for nursing. Avoid wearing underwire-style bras or extremely tight bras (sports bras).  Air-dry your nipples for 3-4 minutes after each feeding.  Use only cotton bra pads to absorb leaked breast milk. Leaking of breast milk between feedings is normal.  Use lanolin on your nipples after breastfeeding. Lanolin helps to maintain your skin's normal moisture barrier. Pure lanolin is not harmful (not toxic) to your baby. You may also hand express a few drops of breast milk and gently massage that milk into your nipples and allow the milk to air-dry. In the first few weeks after giving birth, some women experience breast engorgement. Engorgement can make your breasts feel heavy, warm,  and tender to the touch. Engorgement peaks within 3-5 days after you give birth. The following recommendations can help to ease engorgement:  Completely empty your breasts while breastfeeding or pumping. You may want to start by applying warm, moist heat (in the shower or with warm, water-soaked hand towels) just before feeding or pumping. This increases circulation and helps the milk flow. If your baby does not completely empty your breasts while breastfeeding, pump any extra milk after he or she is finished.  Apply ice packs to your breasts immediately after breastfeeding or pumping, unless this is too uncomfortable for you. To do this: ? Put ice in a plastic bag. ? Place a towel between your skin and the bag. ? Leave the ice on for 20 minutes, 2-3 times a day.  Make sure that your baby is latched on and positioned properly while breastfeeding. If engorgement persists after 48 hours of following these recommendations, contact your health care provider or a lactation consultant. Overall health care recommendations while breastfeeding  Eat 3 healthy meals and 3 snacks every day. Well-nourished mothers who are breastfeeding need an additional 450-500 calories a day. You can meet this requirement by increasing the amount of a balanced diet that   you eat.  Drink enough water to keep your urine pale yellow or clear.  Rest often, relax, and continue to take your prenatal vitamins to prevent fatigue, stress, and low vitamin and mineral levels in your body (nutrient deficiencies).  Do not use any products that contain nicotine or tobacco, such as cigarettes and e-cigarettes. Your baby may be harmed by chemicals from cigarettes that pass into breast milk and exposure to secondhand smoke. If you need help quitting, ask your health care provider.  Avoid alcohol.  Do not use illegal drugs or marijuana.  Talk with your health care provider before taking any medicines. These include over-the-counter and  prescription medicines as well as vitamins and herbal supplements. Some medicines that may be harmful to your baby can pass through breast milk.  It is possible to become pregnant while breastfeeding. If birth control is desired, ask your health care provider about options that will be safe while breastfeeding your baby. Where to find more information: [employer]: www.llli.org Contact a health care provider if:  You feel like you want to stop breastfeeding or have become frustrated with breastfeeding.  Your nipples are cracked or bleeding.  Your breasts are red, tender, or warm.  You have: ? Painful breasts or nipples. ? A swollen area on either breast. ? A fever or chills. ? Nausea or vomiting. ? Drainage other than breast milk from your nipples.  Your breasts do not become full before feedings by the fifth day after you give birth.  You feel sad and depressed.  Your baby is: ? Too sleepy to eat well. ? Having trouble sleeping. ? More than 1 week old and wetting fewer than 6 diapers in a 24-hour period. ? Not gaining weight by 5 days of age.  Your baby has fewer than 3 stools in a 24-hour period.  Your baby's skin or the white parts of his or her eyes become yellow. Get help right away if:  Your baby is overly tired (lethargic) and does not want to wake up and feed.  Your baby develops an unexplained fever. Summary  Breastfeeding offers many health benefits for infant and mothers.  Try to breastfeed your infant when he or she shows early signs of hunger.  Gently tickle or stroke your baby's lips with your finger or nipple to allow the baby to open his or her mouth. Bring the baby to your breast. Make sure that much of the areola is in your baby's mouth. Offer one side and burp the baby before you offer the other side.  Talk with your health care provider or lactation consultant if you have questions or you face problems as you breastfeed. This  information is not intended to replace advice given to you by your health care provider. Make sure you discuss any questions you have with your health care provider. Document Revised: 02/14/2018 Document Reviewed: 12/22/2016 Elsevier Patient Education  2021 Elsevier Inc.  

## 2021-01-06 ENCOUNTER — Other Ambulatory Visit: Payer: Self-pay

## 2021-01-06 ENCOUNTER — Other Ambulatory Visit: Payer: Medicaid Other

## 2021-01-06 LAB — URINE CULTURE, OB REFLEX

## 2021-01-06 LAB — CULTURE, OB URINE

## 2021-01-06 NOTE — Progress Notes (Signed)
Received report from [facility] phleblomist that pt is a hard stick and has requested that pt go to [facility] at another location however the pt is unable to get genetic screening test drawn.  Spoke with pt to see if she is okay with getting her anatomy scan completed which will be very detailed and if she decides at any point in her pregnancy that she would like the genetic screening drawn to please let us know and we will be happy to get her on the lab schedule to get it drawn.  Pt stated that she will just wait for anatomy scan.  Pt did not have any further questions or concerns.    Addison Naegeli, RN 01/06/21

## 2021-01-07 ENCOUNTER — Telehealth: Payer: Self-pay

## 2021-01-07 LAB — CBC/D/PLT+RPR+RH+ABO+RUB AB...
Antibody Screen: NEGATIVE
Basophils Absolute: 0 10*3/uL (ref 0.0–0.2)
Basos: 0 %
EOS (ABSOLUTE): 0 10*3/uL (ref 0.0–0.4)
Eos: 0 %
HCV Ab: <0.1 s/co ratio (ref 0.0–0.9)
HIV Screen 4th Generation wRfx: NONREACTIVE
Hematocrit: 35.8 % (ref 34.0–46.6)
Hemoglobin: 12 g/dL (ref 11.1–15.9)
Hepatitis B Surface Ag: NEGATIVE
Immature Grans (Abs): 0 10*3/uL (ref 0.0–0.1)
Immature Granulocytes: 0 %
Lymphocytes Absolute: 2 10*3/uL (ref 0.7–3.1)
Lymphs: 34 %
MCH: 28.6 pg (ref 26.6–33.0)
MCHC: 33.5 g/dL (ref 31.5–35.7)
MCV: 85 fL (ref 79–97)
Monocytes Absolute: 0.6 10*3/uL (ref 0.1–0.9)
Monocytes: 10 %
Neutrophils Absolute: 3.2 10*3/uL (ref 1.4–7.0)
Neutrophils: 56 %
Platelets: 289 10*3/uL (ref 150–450)
RBC: 4.19 x10E6/uL (ref 3.77–5.28)
RDW: 14.2 % (ref 11.7–15.4)
RPR Ser Ql: NONREACTIVE
Rh Factor: POSITIVE
Rubella Antibodies, IGG: 1.18 index (ref >0.99)
WBC: 5.9 10*3/uL (ref 3.4–10.8)

## 2021-01-07 LAB — HCV INTERPRETATION

## 2021-01-07 LAB — HEMOGLOBIN A1C
Est. average glucose Bld gHb Est-mCnc: 117 mg/dL
Hgb A1c MFr Bld: 5.7 % — ABNORMAL HIGH (ref 4.8–5.6)

## 2021-01-07 NOTE — Telephone Encounter (Addendum)
-----   Message from Venora Maples, MD sent at 01/07/2021  8:47 AM EST ----- Patient with elevated A1c, notified by mychart, please schedule her for 2hr GTT and let her know when the appt will be  Informed pt of results and the need for 2 hr gtt.  Pt reports that she will be able to come in on 01/10/21 for lab draw.  I informed pt to please fasting- nothing to eat or drink midnight the night before appt and that she will be in the facility for two hours.  Pt requested to have her genetic screening redrawn.  I advised pt that we can do that as well in the lab visit.  Pt stated thank you with no further questions.   Angelica Cruz

## 2021-01-10 ENCOUNTER — Other Ambulatory Visit: Payer: Self-pay

## 2021-01-10 ENCOUNTER — Other Ambulatory Visit: Payer: Medicaid Other

## 2021-01-10 DIAGNOSIS — Z3490 Encounter for supervision of normal pregnancy, unspecified, unspecified trimester: Secondary | ICD-10-CM

## 2021-01-10 NOTE — Progress Notes (Unsigned)
Panorama and Horizon kit sent out today.  

## 2021-01-11 ENCOUNTER — Telehealth: Payer: Self-pay

## 2021-01-11 ENCOUNTER — Encounter: Payer: Self-pay | Admitting: Obstetrics & Gynecology

## 2021-01-11 LAB — GLUCOSE TOLERANCE, 2 HOURS W/ 1HR
Glucose, 1 hour: 85 mg/dL (ref 65–179)
Glucose, 2 hour: 86 mg/dL (ref 65–152)
Glucose, Fasting: 78 mg/dL (ref 65–91)

## 2021-01-11 NOTE — Telephone Encounter (Addendum)
-----   Message from Jerene Bears, MD sent at 01/11/2021  4:29 PM EST ----- Please let pt know her 2 hr GTT was normal.  Pt has slightly elevated HbA1C on initial prenatal labs (in the prediabetes range).  She will need this repeated around 28 weeks.    Called pt and informed pt her results and that she well get another 2 hr at 28 wks to make sure she does not have diabetes in pregnancy.  Pt verbalized understanding.  Leonette Nutting  01/11/21

## 2021-02-01 ENCOUNTER — Encounter: Payer: Self-pay | Admitting: *Deleted

## 2021-02-06 ENCOUNTER — Other Ambulatory Visit: Payer: Self-pay | Admitting: Nurse Practitioner

## 2021-02-07 ENCOUNTER — Ambulatory Visit (INDEPENDENT_AMBULATORY_CARE_PROVIDER_SITE_OTHER): Payer: Medicaid Other | Admitting: Nurse Practitioner

## 2021-02-07 ENCOUNTER — Ambulatory Visit: Payer: Medicaid Other

## 2021-02-07 ENCOUNTER — Other Ambulatory Visit: Payer: Self-pay

## 2021-02-07 VITALS — BP 117/64 | HR 78 | Wt 222.9 lb

## 2021-02-07 DIAGNOSIS — Z3A18 18 weeks gestation of pregnancy: Secondary | ICD-10-CM

## 2021-02-07 DIAGNOSIS — Z3490 Encounter for supervision of normal pregnancy, unspecified, unspecified trimester: Secondary | ICD-10-CM

## 2021-02-07 NOTE — Progress Notes (Signed)
    Subjective:  Angelica Cruz is a 25 y.o. G2P1001 at [redacted]w[redacted]d being seen today for ongoing prenatal care.  She is currently monitored for the following issues for this low-risk pregnancy and has Chlamydia infection affecting pregnancy; Trichomoniasis; Supervision of low-risk pregnancy; and Obesity during pregnancy on their problem list.  Patient reports Noticed she is losing weight.  Contractions: Not present. Vag. Bleeding: None.  Movement: Present. Denies leaking of fluid.   The following portions of the patient's history were reviewed and updated as appropriate: allergies, current medications, past family history, past medical history, past social history, past surgical history and problem list. Problem list updated.  Objective:   Vitals:   02/07/21 0842  BP: 117/64  Pulse: 78  Weight: 222 lb 14.4 oz (101.1 kg)    Fetal Status: Fetal Heart Rate (bpm): 139 Fundal Height: 22 cm Movement: Present     General:  Alert, oriented and cooperative. Patient is in no acute distress.  Skin: Skin is warm and dry. No rash noted.   Cardiovascular: Normal heart rate noted  Respiratory: Normal respiratory effort, no problems with respiration noted  Abdomen: Soft, gravid, appropriate for gestational age. Pain/Pressure: Present     Pelvic:  Cervical exam deferred        Extremities: Normal range of motion.  Edema: None  Mental Status: Normal mood and affect. Normal behavior. Normal judgment and thought content.   Urinalysis:      Assessment and Plan:  Pregnancy: G2P1001 at [redacted]w[redacted]d  1. Encounter for supervision of low-risk pregnancy, antepartum Will have [place] tomorrow Works as Production designer, theatre/television/film at [employer] - eats lots of her food there - high fat choices noted in diet recall - advised more fresh fruit and vegetables over fried foods.  Currently has lost weight likely due to changing from her usual juice drinks to water.  Advised that Glucola will be repeated at 28 weeks. Did not breastfeed long with her  previous child - nipples were "torn up".  Wants to breastfeed longer - advised to get help in the hospital with feedings and to come to see lactation consultant here in the clinic at 1-2 days after discharge. My need lactation assistance at birth as she did not breastfeed for long Discussed Covid vaccine and encouraged to get during the pregnancy - is considering and may get in pregnancy.  - AFP, Serum, Open Spina Bifida    Preterm labor symptoms and general obstetric precautions including but not limited to vaginal bleeding, contractions, leaking of fluid and fetal movement were reviewed in detail with the patient. Please refer to After Visit Summary for other counseling recommendations.  Return in about 4 weeks (around 03/07/2021) for in person ROB.  Nolene Bernheim, RN, MSN, NP-BC Nurse Practitioner, [facility] for [employer], [facility] 02/07/2021 9:09 AM

## 2021-02-07 NOTE — Patient Instructions (Signed)
Eating Plan for Pregnant Women While you are pregnant, your body requires additional nutrition to help support your growing baby. You also have a higher need for some vitamins and minerals, such as folic acid, calcium, iron, and vitamin D. Eating a healthy, well-balanced diet is very important for your health and your baby's health. Your need for extra calories varies over the course of your pregnancy. Pregnancy is divided into three trimesters, with each trimester lasting 3 months. For most women, it is recommended to consume:  150 extra calories a day during the first trimester.  300 extra calories a day during the second trimester.  300 extra calories a day during the third trimester. What are tips for following this plan? Cooking  Practice good food safety and cleanliness. Wash your hands before you eat and after you prepare raw meat. Wash all fruits and vegetables well before peeling or eating. Taking these actions can help to prevent foodborne illnesses that can be very dangerous to your baby, such as listeriosis. Ask your health care provider for more information about listeriosis.  Make sure that all meats, poultry, and eggs are cooked to food-safe temperatures or "well-done." Meal planning  Eat a variety of foods (especially fruits and vegetables) to get a full range of vitamins and minerals.  Two or more servings of fish are recommended each week in order to get the most benefits from omega-3 fatty acids that are found in seafood. Choose fish that are lower in mercury, such as salmon and pollock.  Limit your overall intake of foods that have "empty calories." These are foods that have little nutritional value, such as sweets, desserts, candies, and sugar-sweetened beverages.  Drinks that contain caffeine are okay to drink, but it is better to avoid caffeine. Keep your total caffeine intake to less than 200 mg each day (which is 12 oz or 355 mL of coffee, tea, or soda) or the limit as  told by your health care provider.   General information  Do not try to lose weight or go on a diet during pregnancy.  Take a prenatal vitamin to help meet your additional vitamin and mineral needs during pregnancy, specifically for folic acid, iron, calcium, and vitamin D.  Remember to stay active. Ask your health care provider what types of exercise and activities are safe for you. What does 150 extra calories look like? Healthy options that provide 150 extra calories each day could be any of the following:  6-8 oz (170-227 g) plain low-fat yogurt with  cup (70 g) berries.  1 apple with 2 tsp (11 g) peanut butter.  Cut-up vegetables with  cup (60 g) hummus.  8 fl oz (237 mL) low-fat chocolate milk.  1 stick of string cheese with 1 medium orange.  1 peanut butter and jelly sandwich that is made with one slice of whole-wheat bread and 1 tsp (5 g) of peanut butter. For 300 extra calories, you could eat two of these healthy options each day. What is a healthy amount of weight to gain? The right amount of weight gain for you is based on your BMI (body mass index) before you became pregnant.  If your BMI was less than 18 (underweight), you should gain 28-40 lb (13-18 kg).  If your BMI was 18-24.9 (normal), you should gain 25-35 lb (11-16 kg).  If your BMI was 25-29.9 (overweight), you should gain 15-25 lb (7-11 kg).  If your BMI was 30 or greater (obese), you should gain 11-20 lb (  5-9 kg). What if I am having twins or multiples? Generally, if you are carrying twins or multiples:  You may need to eat 300-600 extra calories a day.  The recommended range for total weight gain is 25-54 lb (11-25 kg), depending on your BMI before pregnancy.  Talk with your health care provider to find out about nutritional needs, weight gain, and exercise that is right for you. What foods should I eat? Fruits All fruits. Eat a variety of colors and types of fruit. Remember to wash your fruits well  before peeling or eating. Vegetables All vegetables. Eat a variety of colors and types of vegetables. Remember to wash your vegetables well before peeling or eating. Grains All grains. Choose whole grains, such as whole-wheat bread, oatmeal, or brown rice. Meats and other protein foods Lean meats, including chicken, [place], and lean cuts of beef, veal, or pork. Fish that is higher in omega-3 fatty acids and lower in mercury, such as salmon, herring, mussels, trout, sardines, pollock, shrimp, crab, and lobster. Tofu. Tempeh. Beans. Eggs. Peanut butter and other nut butters. Dairy Pasteurized milk and milk alternatives, such as almond milk. Pasteurized yogurt and pasteurized cheese. Cottage cheese. Sour cream. Beverages Water. Juices that contain 100% fruit juice or vegetable juice. Caffeine-free teas and decaffeinated coffee. Fats and oils Fats and oils are okay to include in moderation. Sweets and desserts Sweets and desserts are okay to include in moderation. Seasoning and other foods All pasteurized condiments. The items listed above may not be a complete list of foods and beverages you can eat. Contact a dietitian for more information.   What foods should I avoid? Fruits Raw (unpasteurized) fruit juices. Vegetables Unpasteurized vegetable juices. Meats and other protein foods Precooked or cured meat, such as bologna, hot dogs, sausages, or meat loaves. (If you must eat those meats, reheat them until they are steaming hot.) Refrigerated pate, meat spreads from a meat counter, or smoked seafood that is found in the refrigerated section of a store. Raw or undercooked meats, poultry, and eggs. Raw fish, such as sushi or sashimi. Fish that have high mercury content, such as tilefish, shark, swordfish, and king mackerel. Dairy Unpasteurized milk and any foods that have unpasteurized milk in them. Soft cheeses, such as feta, queso blanco, queso fresco, [place], [place], panela, and blue-veined  cheeses (unless they are made with pasteurized milk, which must be stated on the label). Beverages Alcohol. Sugar-sweetened beverages, such as sodas, teas, or energy drinks. Seasoning and other foods Homemade fermented foods and drinks, such as pickles, sauerkraut, or kombucha drinks. (Store-bought pasteurized versions of these are okay.) Salads that are made in a store or deli, such as ham salad, chicken salad, egg salad, tuna salad, and seafood salad. The items listed above may not be a complete list of foods and beverages you should avoid. Contact a dietitian for more information. Where to find more information To calculate the number of calories you need based on your height, weight, and activity level, you can use an online calculator such as:  https://www.hunter.com/ To calculate how much weight you should gain during pregnancy, you can use an online pregnancy weight gain calculator such as:  MassVoice.es To learn more about eating fish during pregnancy, talk with your health care provider or visit:  [employer]ch Summary  While you are pregnant, your body requires additional nutrition to help support your growing baby.  Eat a variety of foods, especially fruits and vegetables, to get a full range of vitamins and minerals.  Practice good food safety and cleanliness. Wash your hands before you eat and after you prepare raw meat. Wash all fruits and vegetables well before peeling or eating. Taking these actions can help to prevent foodborne illnesses, such as listeriosis, that can be very dangerous to your baby.  Do not eat raw meat or fish. Do not eat fish that have high mercury content, such as tilefish, shark, swordfish, and king mackerel. Do not eat raw (unpasteurized) dairy.  Take a prenatal vitamin to help meet your additional vitamin and mineral needs during pregnancy, specifically for folic acid, iron, calcium, and vitamin D. This information is not intended to replace  advice given to you by your health care provider. Make sure you discuss any questions you have with your health care provider. Document Revised: 06/17/2020 Document Reviewed: 06/17/2020 Elsevier Patient Education  2021 Elsevier Inc.   

## 2021-02-08 ENCOUNTER — Ambulatory Visit (HOSPITAL_BASED_OUTPATIENT_CLINIC_OR_DEPARTMENT_OTHER): Payer: Medicaid Other

## 2021-02-08 ENCOUNTER — Ambulatory Visit: Payer: Medicaid Other | Attending: Advanced Practice Midwife | Admitting: *Deleted

## 2021-02-08 ENCOUNTER — Encounter: Payer: Self-pay | Admitting: *Deleted

## 2021-02-08 DIAGNOSIS — Z3A19 19 weeks gestation of pregnancy: Secondary | ICD-10-CM | POA: Diagnosis not present

## 2021-02-08 DIAGNOSIS — O99212 Obesity complicating pregnancy, second trimester: Secondary | ICD-10-CM | POA: Insufficient documentation

## 2021-02-08 DIAGNOSIS — Z3490 Encounter for supervision of normal pregnancy, unspecified, unspecified trimester: Secondary | ICD-10-CM

## 2021-02-08 DIAGNOSIS — Z363 Encounter for antenatal screening for malformations: Secondary | ICD-10-CM

## 2021-02-08 DIAGNOSIS — O99210 Obesity complicating pregnancy, unspecified trimester: Secondary | ICD-10-CM

## 2021-02-08 DIAGNOSIS — E669 Obesity, unspecified: Secondary | ICD-10-CM | POA: Insufficient documentation

## 2021-02-09 ENCOUNTER — Other Ambulatory Visit: Payer: Self-pay | Admitting: *Deleted

## 2021-02-09 DIAGNOSIS — O43199 Other malformation of placenta, unspecified trimester: Secondary | ICD-10-CM

## 2021-02-14 LAB — AFP, SERUM, OPEN SPINA BIFIDA
AFP MoM: 1.37
AFP Value: 56.5 ng/mL
Gest. Age on Collection Date: 18.6 weeks
Maternal Age At EDD: 23.7 yr
OSBR Risk 1 IN: 7840
Test Results:: NEGATIVE
Weight: 222 [lb_av]

## 2021-02-21 ENCOUNTER — Encounter: Payer: Self-pay | Admitting: *Deleted

## 2021-03-07 ENCOUNTER — Other Ambulatory Visit: Payer: Self-pay

## 2021-03-07 ENCOUNTER — Ambulatory Visit (INDEPENDENT_AMBULATORY_CARE_PROVIDER_SITE_OTHER): Payer: Medicaid Other | Admitting: Nurse Practitioner

## 2021-03-07 VITALS — BP 122/81 | HR 88 | Wt 223.7 lb

## 2021-03-07 DIAGNOSIS — Z3492 Encounter for supervision of normal pregnancy, unspecified, second trimester: Secondary | ICD-10-CM

## 2021-03-07 DIAGNOSIS — O99210 Obesity complicating pregnancy, unspecified trimester: Secondary | ICD-10-CM

## 2021-03-07 DIAGNOSIS — Z3A22 22 weeks gestation of pregnancy: Secondary | ICD-10-CM

## 2021-03-07 NOTE — Progress Notes (Signed)
Pt states sharpe pain at top of stomach, little diarrhea but had some type of fish last night that may have caused it & nausea.

## 2021-03-07 NOTE — Progress Notes (Signed)
    Subjective:  Angelica Cruz is a 25 y.o. G2P1001 at [redacted]w[redacted]d being seen today for ongoing prenatal care.  She is currently monitored for the following issues for this low-risk pregnancy and has Chlamydia infection affecting pregnancy; Trichomoniasis; Supervision of low-risk pregnancy; and Obesity during pregnancy on their problem list.  Patient reports vomiting.  Contractions: Not present. Vag. Bleeding: None.  Movement: Present. Denies leaking of fluid.   The following portions of the patient's history were reviewed and updated as appropriate: allergies, current medications, past family history, past medical history, past social history, past surgical history and problem list. Problem list updated.  Objective:   Vitals:   03/07/21 0829  BP: 122/81  Pulse: 88  Weight: 223 lb 11.2 oz (101.5 kg)    Fetal Status: Fetal Heart Rate (bpm): 142 Fundal Height: 27 cm Movement: Present     General:  Alert, oriented and cooperative. Patient is in no acute distress.  Skin: Skin is warm and dry. No rash noted.   Cardiovascular: Normal heart rate noted  Respiratory: Normal respiratory effort, no problems with respiration noted  Abdomen: Soft, gravid, appropriate for gestational age. Pain/Pressure: Present     Pelvic:  Cervical exam deferred        Extremities: Normal range of motion.  Edema: None  Mental Status: Normal mood and affect. Normal behavior. Normal judgment and thought content.   Urinalysis:      Assessment and Plan:  Pregnancy: G2P1001 at [redacted]w[redacted]d  1. Encounter for supervision of low-risk pregnancy in second trimester Reviewed guidance for next visit for glucola and TDAP. Reviewed getting breastfeeding help early and even can be seen by the lactation consultant in the office in the first week after discharge.  Plans to breastfeed for 6 months and did breastfeed for 2 weeks with her last child. Has growth [place] tomorrow. Reminded to check BP weekly and add to babyscripts.  Last  one in early March.   2. Obesity during pregnancy   3. Vomiting today only - with diarrhea this morning Had fried fish last night.  Vomited this AM and some diarrhea.  Reviewed no high fat food or spicy food today. To call the office if she needs medication to help with nausea or vomiting but expect it to resolve through the day today.  Likely due to fried food yesterday.  Preterm labor symptoms and general obstetric precautions including but not limited to vaginal bleeding, contractions, leaking of fluid and fetal movement were reviewed in detail with the patient. Please refer to After Visit Summary for other counseling recommendations.  Return in about 4 weeks (around 04/04/2021) for early AM appt for ROB and fasting 2 hr glucola.  Nolene Bernheim, RN, MSN, NP-BC Nurse Practitioner, [facility] for [employer], [facility] 03/07/2021 9:14 AM

## 2021-03-08 ENCOUNTER — Encounter: Payer: Self-pay | Admitting: *Deleted

## 2021-03-08 ENCOUNTER — Ambulatory Visit: Payer: Medicaid Other | Attending: Obstetrics

## 2021-03-08 ENCOUNTER — Ambulatory Visit: Payer: Medicaid Other | Admitting: *Deleted

## 2021-03-08 ENCOUNTER — Other Ambulatory Visit: Payer: Self-pay | Admitting: *Deleted

## 2021-03-08 DIAGNOSIS — O99210 Obesity complicating pregnancy, unspecified trimester: Secondary | ICD-10-CM | POA: Diagnosis present

## 2021-03-08 DIAGNOSIS — O99212 Obesity complicating pregnancy, second trimester: Secondary | ICD-10-CM

## 2021-03-08 DIAGNOSIS — O322XX0 Maternal care for transverse and oblique lie, not applicable or unspecified: Secondary | ICD-10-CM | POA: Diagnosis not present

## 2021-03-08 DIAGNOSIS — O43199 Other malformation of placenta, unspecified trimester: Secondary | ICD-10-CM | POA: Insufficient documentation

## 2021-03-08 DIAGNOSIS — O358XX0 Maternal care for other (suspected) fetal abnormality and damage, not applicable or unspecified: Secondary | ICD-10-CM

## 2021-03-08 DIAGNOSIS — Z3A23 23 weeks gestation of pregnancy: Secondary | ICD-10-CM

## 2021-03-08 DIAGNOSIS — O35EXX0 Maternal care for other (suspected) fetal abnormality and damage, fetal genitourinary anomalies, not applicable or unspecified: Secondary | ICD-10-CM

## 2021-03-08 DIAGNOSIS — Z363 Encounter for antenatal screening for malformations: Secondary | ICD-10-CM | POA: Diagnosis not present

## 2021-04-04 ENCOUNTER — Ambulatory Visit (INDEPENDENT_AMBULATORY_CARE_PROVIDER_SITE_OTHER): Payer: Medicaid Other | Admitting: Student

## 2021-04-04 ENCOUNTER — Other Ambulatory Visit: Payer: Self-pay

## 2021-04-04 ENCOUNTER — Other Ambulatory Visit: Payer: Medicaid Other

## 2021-04-04 VITALS — BP 105/71 | HR 86

## 2021-04-04 DIAGNOSIS — Z3492 Encounter for supervision of normal pregnancy, unspecified, second trimester: Secondary | ICD-10-CM

## 2021-04-04 DIAGNOSIS — Z3493 Encounter for supervision of normal pregnancy, unspecified, third trimester: Secondary | ICD-10-CM

## 2021-04-04 DIAGNOSIS — Z23 Encounter for immunization: Secondary | ICD-10-CM | POA: Diagnosis not present

## 2021-04-04 DIAGNOSIS — Z3A28 28 weeks gestation of pregnancy: Secondary | ICD-10-CM | POA: Diagnosis not present

## 2021-04-04 NOTE — Progress Notes (Signed)
    PRENATAL VISIT NOTE  Subjective:  DORIANNA Cruz is a 25 y.o. G2P1001 at [redacted]w[redacted]d being seen today for ongoing prenatal care.  She is currently monitored for the following issues for this low-risk pregnancy and has Chlamydia infection affecting pregnancy; Trichomoniasis; Supervision of low-risk pregnancy; and Obesity during pregnancy on their problem list.  Patient reports no complaints.  Contractions: Not present. Vag. Bleeding: None.  Movement: Present. Denies leaking of fluid.   The following portions of the patient's history were reviewed and updated as appropriate: allergies, current medications, past family history, past medical history, past social history, past surgical history and problem list.   Objective:   Vitals:   04/04/21 0931  BP: 105/71  Pulse: 86    Fetal Status: Fetal Heart Rate (bpm): 131 Fundal Height: 30 cm Movement: Present     General:  Alert, oriented and cooperative. Patient is in no acute distress.  Skin: Skin is warm and dry. No rash noted.   Cardiovascular: Normal heart rate noted  Respiratory: Normal respiratory effort, no problems with respiration noted  Abdomen: Soft, gravid, appropriate for gestational age.  Pain/Pressure: Absent     Pelvic: Cervical exam deferred        Extremities: Normal range of motion.  Edema: None  Mental Status: Normal mood and affect. Normal behavior. Normal judgment and thought content.   Assessment and Plan:  Pregnancy: G2P1001 at [redacted]w[redacted]d 1. [redacted] weeks gestation of pregnancy -keep appt on 5/17 for repeat [place] -No birth control right now; may want to have another one!  -2 hour in process - Tdap vaccine greater than or equal to 7yo IM  Preterm labor symptoms and general obstetric precautions including but not limited to vaginal bleeding, contractions, leaking of fluid and fetal movement were reviewed in detail with the patient. Please refer to After Visit Summary for other counseling recommendations.   Return in  about 2 weeks (around 04/18/2021), or LROB with KK.  Future Appointments  Date Time Provider Department Center  04/19/2021  9:45 AM [facility] NURSE [facility]  04/19/2021 10:00 AM [facility]    Marylene Land, CNM

## 2021-04-05 LAB — GLUCOSE TOLERANCE, 2 HOURS W/ 1HR
Glucose, 1 hour: 100 mg/dL (ref 65–179)
Glucose, 2 hour: 85 mg/dL (ref 65–152)
Glucose, Fasting: 81 mg/dL (ref 65–91)

## 2021-04-05 LAB — HIV ANTIBODY (ROUTINE TESTING W REFLEX): HIV Screen 4th Generation wRfx: NONREACTIVE

## 2021-04-05 LAB — CBC
Hematocrit: 29.4 % — ABNORMAL LOW (ref 34.0–46.6)
Hemoglobin: 9.6 g/dL — ABNORMAL LOW (ref 11.1–15.9)
MCH: 27.9 pg (ref 26.6–33.0)
MCHC: 32.7 g/dL (ref 31.5–35.7)
MCV: 86 fL (ref 79–97)
Platelets: 273 10*3/uL (ref 150–450)
RBC: 3.44 x10E6/uL — ABNORMAL LOW (ref 3.77–5.28)
RDW: 13.1 % (ref 11.7–15.4)
WBC: 6.9 10*3/uL (ref 3.4–10.8)

## 2021-04-05 LAB — RPR: RPR Ser Ql: NONREACTIVE

## 2021-04-06 ENCOUNTER — Other Ambulatory Visit: Payer: Self-pay | Admitting: Student

## 2021-04-06 DIAGNOSIS — O99012 Anemia complicating pregnancy, second trimester: Secondary | ICD-10-CM | POA: Insufficient documentation

## 2021-04-06 MED ORDER — FERROUS SULFATE 324 (65 FE) MG PO TBEC: 1.0000 | DELAYED_RELEASE_TABLET | ORAL | 1 refills | Status: DC

## 2021-04-19 ENCOUNTER — Other Ambulatory Visit: Payer: Self-pay | Admitting: *Deleted

## 2021-04-19 ENCOUNTER — Other Ambulatory Visit: Payer: Self-pay

## 2021-04-19 ENCOUNTER — Ambulatory Visit: Payer: Medicaid Other | Admitting: *Deleted

## 2021-04-19 ENCOUNTER — Ambulatory Visit: Payer: Medicaid Other | Attending: Maternal & Fetal Medicine

## 2021-04-19 ENCOUNTER — Encounter: Payer: Self-pay | Admitting: *Deleted

## 2021-04-19 DIAGNOSIS — O35EXX0 Maternal care for other (suspected) fetal abnormality and damage, fetal genitourinary anomalies, not applicable or unspecified: Secondary | ICD-10-CM

## 2021-04-19 DIAGNOSIS — O43199 Other malformation of placenta, unspecified trimester: Secondary | ICD-10-CM

## 2021-04-19 DIAGNOSIS — E669 Obesity, unspecified: Secondary | ICD-10-CM

## 2021-04-19 DIAGNOSIS — Z3A29 29 weeks gestation of pregnancy: Secondary | ICD-10-CM

## 2021-04-19 DIAGNOSIS — O358XX0 Maternal care for other (suspected) fetal abnormality and damage, not applicable or unspecified: Secondary | ICD-10-CM | POA: Insufficient documentation

## 2021-04-19 DIAGNOSIS — O99213 Obesity complicating pregnancy, third trimester: Secondary | ICD-10-CM

## 2021-04-19 DIAGNOSIS — O99210 Obesity complicating pregnancy, unspecified trimester: Secondary | ICD-10-CM

## 2021-04-19 DIAGNOSIS — Z362 Encounter for other antenatal screening follow-up: Secondary | ICD-10-CM | POA: Diagnosis not present

## 2021-04-21 ENCOUNTER — Ambulatory Visit (INDEPENDENT_AMBULATORY_CARE_PROVIDER_SITE_OTHER): Payer: Medicaid Other | Admitting: Student

## 2021-04-21 ENCOUNTER — Other Ambulatory Visit: Payer: Self-pay

## 2021-04-21 VITALS — BP 110/73 | HR 97 | Wt 227.0 lb

## 2021-04-21 DIAGNOSIS — Z3493 Encounter for supervision of normal pregnancy, unspecified, third trimester: Secondary | ICD-10-CM

## 2021-04-21 DIAGNOSIS — Z3A29 29 weeks gestation of pregnancy: Secondary | ICD-10-CM

## 2021-04-21 NOTE — Progress Notes (Signed)
Patient stated that she is doing well and has no questions or concerns

## 2021-04-21 NOTE — Progress Notes (Signed)
Patient ID: Angelica Cruz, female   DOB: [birth year], 25 y.o.   MRN: [id number]   PRENATAL VISIT NOTE  Subjective:  Angelica Cruz is a 25 y.o. G2P1001 at [redacted]w[redacted]d being seen today for ongoing prenatal care.  She is currently monitored for the following issues for this low-risk pregnancy and has Chlamydia infection affecting pregnancy; Trichomoniasis; Supervision of low-risk pregnancy; Obesity during pregnancy; and Anemia affecting pregnancy in second trimester on their problem list.  Patient reports no complaints.  Contractions: Not present. Vag. Bleeding: None.  Movement: Present. Denies leaking of fluid.   The following portions of the patient's history were reviewed and updated as appropriate: allergies, current medications, past family history, past medical history, past social history, past surgical history and problem list.   Objective:   Vitals:   04/21/21 1056  BP: 110/73  Pulse: 97  Weight: 227 lb (103 kg)    Fetal Status: Fetal Heart Rate (bpm): 135 Fundal Height: 31 cm Movement: Present     General:  Alert, oriented and cooperative. Patient is in no acute distress.  Skin: Skin is warm and dry. No rash noted.   Cardiovascular: Normal heart rate noted  Respiratory: Normal respiratory effort, no problems with respiration noted  Abdomen: Soft, gravid, appropriate for gestational age.  Pain/Pressure: Absent     Pelvic: Cervical exam deferred        Extremities: Normal range of motion.  Edema: None  Mental Status: Normal mood and affect. Normal behavior. Normal judgment and thought content.   Assessment and Plan:  Pregnancy: G2P1001 at [redacted]w[redacted]d 1. Encounter for supervision of low-risk pregnancy in third trimester    -keep appt on 6-14 for growth -Reviewed [place] , renal UTD resolved   Preterm labor symptoms and general obstetric precautions including but not limited to vaginal bleeding, contractions, leaking of fluid and fetal movement were reviewed in detail with the  patient. Please refer to After Visit Summary for other counseling recommendations.   Return in about 3 weeks (around 05/12/2021), or LROB with KK.  Future Appointments  Date Time Provider Department Center  05/17/2021 12:30 PM [facility] NURSE [facility]  05/17/2021 12:45 PM [facility] US5 [facility]    Marylene Land, CNM

## 2021-05-16 ENCOUNTER — Other Ambulatory Visit: Payer: Self-pay

## 2021-05-16 ENCOUNTER — Ambulatory Visit (INDEPENDENT_AMBULATORY_CARE_PROVIDER_SITE_OTHER): Payer: Medicaid Other | Admitting: Student

## 2021-05-16 VITALS — BP 107/77 | HR 109 | Wt 223.0 lb

## 2021-05-16 DIAGNOSIS — Z3493 Encounter for supervision of normal pregnancy, unspecified, third trimester: Secondary | ICD-10-CM

## 2021-05-16 DIAGNOSIS — Z3A32 32 weeks gestation of pregnancy: Secondary | ICD-10-CM

## 2021-05-16 NOTE — Progress Notes (Signed)
   PRENATAL VISIT NOTE  Subjective:  Angelica Cruz is a 25 y.o. G2P1001 at [redacted]w[redacted]d being seen today for ongoing prenatal care.  She is currently monitored for the following issues for this low-risk pregnancy and has Chlamydia infection affecting pregnancy; Trichomoniasis; Supervision of low-risk pregnancy; Obesity during pregnancy; and Anemia affecting pregnancy in second trimester on their problem list.  Patient reports no complaints.  Contractions: Not present. Vag. Bleeding: None.  Movement: Present. Denies leaking of fluid.   The following portions of the patient's history were reviewed and updated as appropriate: allergies, current medications, past family history, past medical history, past social history, past surgical history and problem list.   Objective:   Vitals:   05/16/21 0923  BP: 107/77  Pulse: (!) 109  Weight: 223 lb (101.2 kg)    Fetal Status: Fetal Heart Rate (bpm): 136 Fundal Height: 32 cm Movement: Present     General:  Alert, oriented and cooperative. Patient is in no acute distress.  Skin: Skin is warm and dry. No rash noted.   Cardiovascular: Normal heart rate noted  Respiratory: Normal respiratory effort, no problems with respiration noted  Abdomen: Soft, gravid, appropriate for gestational age.  Pain/Pressure: Present     Pelvic: Cervical exam deferred        Extremities: Normal range of motion.  Edema: None  Mental Status: Normal mood and affect. Normal behavior. Normal judgment and thought content.   Assessment and Plan:  Pregnancy: G2P1001 at [redacted]w[redacted]d 1. [redacted] weeks gestation of pregnancy    -discussed normal aches and pains of pregnancy -reviewed 2 hour results -keep [place] tomorrow -reviewed past [place] results; renal UTD is resolved  Preterm labor symptoms and general obstetric precautions including but not limited to vaginal bleeding, contractions, leaking of fluid and fetal movement were reviewed in detail with the patient. Please refer to After Visit  Summary for other counseling recommendations.   Return in about 2 weeks (around 05/30/2021), or LROB with KK.  Future Appointments  Date Time Provider Department Center  05/17/2021 12:30 PM [facility] NURSE [facility]  05/17/2021 12:45 PM [facility] US5 [facility]  05/30/2021  9:35 AM Crisoforo Oxford, Charlesetta Garibaldi, CNM [facility]    Marylene Land, CNM

## 2021-05-17 ENCOUNTER — Ambulatory Visit: Payer: Medicaid Other | Admitting: *Deleted

## 2021-05-17 ENCOUNTER — Ambulatory Visit: Payer: Medicaid Other | Attending: Obstetrics and Gynecology

## 2021-05-17 ENCOUNTER — Other Ambulatory Visit: Payer: Self-pay | Admitting: *Deleted

## 2021-05-17 ENCOUNTER — Encounter: Payer: Self-pay | Admitting: *Deleted

## 2021-05-17 VITALS — BP 108/67 | HR 98

## 2021-05-17 DIAGNOSIS — O99213 Obesity complicating pregnancy, third trimester: Secondary | ICD-10-CM | POA: Diagnosis not present

## 2021-05-17 DIAGNOSIS — O43193 Other malformation of placenta, third trimester: Secondary | ICD-10-CM

## 2021-05-17 DIAGNOSIS — O43199 Other malformation of placenta, unspecified trimester: Secondary | ICD-10-CM | POA: Diagnosis present

## 2021-05-17 DIAGNOSIS — Z362 Encounter for other antenatal screening follow-up: Secondary | ICD-10-CM | POA: Diagnosis not present

## 2021-05-17 DIAGNOSIS — O99210 Obesity complicating pregnancy, unspecified trimester: Secondary | ICD-10-CM | POA: Insufficient documentation

## 2021-05-17 DIAGNOSIS — Z3A33 33 weeks gestation of pregnancy: Secondary | ICD-10-CM

## 2021-05-30 ENCOUNTER — Ambulatory Visit (INDEPENDENT_AMBULATORY_CARE_PROVIDER_SITE_OTHER): Payer: Medicaid Other | Admitting: Student

## 2021-05-30 ENCOUNTER — Other Ambulatory Visit: Payer: Self-pay

## 2021-05-30 VITALS — BP 92/67 | HR 131 | Wt 222.6 lb

## 2021-05-30 DIAGNOSIS — Z3402 Encounter for supervision of normal first pregnancy, second trimester: Secondary | ICD-10-CM

## 2021-05-30 DIAGNOSIS — Z3A34 34 weeks gestation of pregnancy: Secondary | ICD-10-CM

## 2021-05-30 NOTE — Progress Notes (Signed)
   PRENATAL VISIT NOTE  Subjective:  Angelica Cruz is a 25 y.o. G2P1001 at [redacted]w[redacted]d being seen today for ongoing prenatal care.  She is currently monitored for the following issues for this low-risk pregnancy and has Chlamydia infection affecting pregnancy; Trichomoniasis; Supervision of low-risk pregnancy; Obesity during pregnancy; and Anemia affecting pregnancy in second trimester on their problem list.  Patient reports backache.  She reports feeling tired. She denies NV, diarrhea, dysuria, abnormal vaginal discharge, contractions, LOF. She reports that she is moving and is doing a lot of lifting and packing. Contractions: Irritability. Vag. Bleeding: None.  Movement: Present. Denies leaking of fluid.   The following portions of the patient's history were reviewed and updated as appropriate: allergies, current medications, past family history, past medical history, past social history, past surgical history and problem list.   Objective:   Vitals:   05/30/21 0948  BP: 92/67  Pulse: (!) 131  Weight: 222 lb 9.6 oz (101 kg)    Fetal Status: Fetal Heart Rate (bpm): 149 Fundal Height: 35 cm Movement: Present     General:  Alert, oriented and cooperative. Patient is in no acute distress.  Skin: Skin is warm and dry. No rash noted.   Cardiovascular: Normal heart rate noted  Respiratory: Normal respiratory effort, no problems with respiration noted  Abdomen: Soft, gravid, appropriate for gestational age.  Pain/Pressure: Present     Pelvic: Cervical exam deferred        Extremities: Normal range of motion.  Edema: None  Mental Status: Normal mood and affect. Normal behavior. Normal judgment and thought content.   Assessment and Plan:  Pregnancy: G2P1001 at [redacted]w[redacted]d 1. [redacted] weeks gestation of pregnancy -Discussed comfort measures -Discussed IOL at 39 weeks -Patient not sure about birth control; she will think about it and have decided by next visit -Discussed 36 week appt; cultures, exam.   -All questions answered   Preterm labor symptoms and general obstetric precautions including but not limited to vaginal bleeding, contractions, leaking of fluid and fetal movement were reviewed in detail with the patient. Please refer to After Visit Summary for other counseling recommendations.   Return in about 3 weeks (around 06/20/2021), or LROB with KK.  Future Appointments  Date Time Provider Department Center  06/09/2021  3:15 PM [facility] NST [facility]  06/17/2021 11:15 AM [facility] NURSE [facility]  06/17/2021 11:30 AM [facility] US3 [facility]  06/21/2021  9:55 AM Roczen Waymire, Charlesetta Garibaldi, CNM [facility]    Marylene Land, CNM

## 2021-05-30 NOTE — Progress Notes (Signed)
Patient complains of daily sharp pains in abdomen along with lower back pain. Patient stated that she would have these sharp pains about 2-3 times a day

## 2021-06-09 ENCOUNTER — Ambulatory Visit: Payer: Medicaid Other | Admitting: *Deleted

## 2021-06-09 ENCOUNTER — Other Ambulatory Visit: Payer: Self-pay

## 2021-06-09 ENCOUNTER — Ambulatory Visit (INDEPENDENT_AMBULATORY_CARE_PROVIDER_SITE_OTHER): Payer: Medicaid Other

## 2021-06-09 DIAGNOSIS — O99210 Obesity complicating pregnancy, unspecified trimester: Secondary | ICD-10-CM

## 2021-06-09 LAB — FETAL NONSTRESS TEST

## 2021-06-10 ENCOUNTER — Telehealth: Payer: Self-pay | Admitting: *Deleted

## 2021-06-10 NOTE — Telephone Encounter (Signed)
Spoke with patient via phone.  Appointments given.  Patient states understanding and agreement.

## 2021-06-17 ENCOUNTER — Other Ambulatory Visit: Payer: Self-pay

## 2021-06-17 ENCOUNTER — Encounter: Payer: Self-pay | Admitting: *Deleted

## 2021-06-17 ENCOUNTER — Ambulatory Visit: Payer: Medicaid Other | Admitting: *Deleted

## 2021-06-17 ENCOUNTER — Ambulatory Visit: Payer: Medicaid Other | Attending: Maternal & Fetal Medicine

## 2021-06-17 ENCOUNTER — Other Ambulatory Visit: Payer: Self-pay | Admitting: Maternal & Fetal Medicine

## 2021-06-17 VITALS — BP 103/68 | HR 102

## 2021-06-17 DIAGNOSIS — Z3A37 37 weeks gestation of pregnancy: Secondary | ICD-10-CM

## 2021-06-17 DIAGNOSIS — O43199 Other malformation of placenta, unspecified trimester: Secondary | ICD-10-CM | POA: Diagnosis present

## 2021-06-17 DIAGNOSIS — O99213 Obesity complicating pregnancy, third trimester: Secondary | ICD-10-CM | POA: Diagnosis present

## 2021-06-17 DIAGNOSIS — O99210 Obesity complicating pregnancy, unspecified trimester: Secondary | ICD-10-CM | POA: Insufficient documentation

## 2021-06-21 ENCOUNTER — Ambulatory Visit (INDEPENDENT_AMBULATORY_CARE_PROVIDER_SITE_OTHER): Payer: Medicaid Other | Admitting: General Practice

## 2021-06-21 ENCOUNTER — Ambulatory Visit (INDEPENDENT_AMBULATORY_CARE_PROVIDER_SITE_OTHER): Payer: Medicaid Other | Admitting: Student

## 2021-06-21 ENCOUNTER — Ambulatory Visit (INDEPENDENT_AMBULATORY_CARE_PROVIDER_SITE_OTHER): Payer: Medicaid Other

## 2021-06-21 ENCOUNTER — Other Ambulatory Visit: Payer: Self-pay

## 2021-06-21 ENCOUNTER — Other Ambulatory Visit (HOSPITAL_COMMUNITY)
Admission: RE | Admit: 2021-06-21 | Discharge: 2021-06-21 | Disposition: A | Discharge status: Home / Self Care | Payer: Medicaid Other | Source: Ambulatory Visit | Attending: Student | Admitting: Student

## 2021-06-21 VITALS — BP 114/77 | HR 118 | Wt 226.7 lb

## 2021-06-21 DIAGNOSIS — Z3A38 38 weeks gestation of pregnancy: Secondary | ICD-10-CM

## 2021-06-21 DIAGNOSIS — Z3493 Encounter for supervision of normal pregnancy, unspecified, third trimester: Secondary | ICD-10-CM | POA: Insufficient documentation

## 2021-06-21 DIAGNOSIS — O99210 Obesity complicating pregnancy, unspecified trimester: Secondary | ICD-10-CM

## 2021-06-21 NOTE — Progress Notes (Signed)
Pt informed that the ultrasound is considered a limited OB ultrasound and is not intended to be a complete ultrasound exam.  Patient also informed that the ultrasound is not being completed with the intent of assessing for fetal or placental anomalies or any pelvic abnormalities.  Explained that the purpose of today's ultrasound is to assess for  BPP, presentation, and AFI.  Patient acknowledges the purpose of the exam and the limitations of the study.     Giann Obara H RN BSN 06/21/21  

## 2021-06-21 NOTE — Progress Notes (Signed)
   PRENATAL VISIT NOTE  Subjective:  Angelica Cruz is a 25 y.o. G2P1001 at [redacted]w[redacted]d being seen today for ongoing prenatal care.  She is currently monitored for the following issues for this low-risk pregnancy and has Chlamydia infection affecting pregnancy; Trichomoniasis; Supervision of low-risk pregnancy; Obesity during pregnancy; and Anemia affecting pregnancy in second trimester on their problem list.  Patient reports  some contractions .  Contractions: Irritability. Vag. Bleeding: None.  Movement: Present. Denies leaking of fluid.   The following portions of the patient's history were reviewed and updated as appropriate: allergies, current medications, past family history, past medical history, past social history, past surgical history and problem list.   Objective:   Vitals:   06/21/21 1004  BP: 114/77  Pulse: (!) 118  Weight: 226 lb 11.2 oz (102.8 kg)    Fetal Status: Fetal Heart Rate (bpm): 128 Fundal Height: 42 cm Movement: Present     General:  Alert, oriented and cooperative. Patient is in no acute distress.  Skin: Skin is warm and dry. No rash noted.   Cardiovascular: Normal heart rate noted  Respiratory: Normal respiratory effort, no problems with respiration noted  Abdomen: Soft, gravid, appropriate for gestational age.  Pain/Pressure: Present     Pelvic: Cervical exam performed in the presence of a chaperone        Extremities: Normal range of motion.  Edema: None  Mental Status: Normal mood and affect. Normal behavior. Normal judgment and thought content.   Assessment and Plan:  Pregnancy: G2P1001 at [redacted]w[redacted]d 1. Encounter for supervision of low-risk pregnancy in third trimester -keep [place] for growth -talk about IOL  - GC/Chlamydia probe amp ([place])not at [facility] - Culture, beta strep (group b only) -Reviewed antenatal testing from last week; reassuring and she will have NST/BPP today (marginal cord insertion)   Term labor symptoms and general obstetric  precautions including but not limited to vaginal bleeding, contractions, leaking of fluid and fetal movement were reviewed in detail with the patient. Please refer to After Visit Summary for other counseling recommendations.   Return in about 1 week (around 06/28/2021), or LROB with KK or another CNM.  Future Appointments  Date Time Provider Department Center  06/28/2021 10:15 AM [facility] NST [facility]  07/01/2021 10:35 AM Allayne Stack, DO [facility]    Marylene Land, [place]

## 2021-06-22 LAB — GC/CHLAMYDIA PROBE AMP (~~LOC~~) NOT AT ARMC
Chlamydia: NEGATIVE
Comment: NEGATIVE
Comment: NORMAL
Neisseria Gonorrhea: NEGATIVE

## 2021-06-23 ENCOUNTER — Inpatient Hospital Stay (HOSPITAL_COMMUNITY)
Admission: AD | Admit: 2021-06-23 | Discharge: 2021-06-23 | Disposition: A | Discharge status: Home / Self Care | Payer: Medicaid Other | Attending: Obstetrics & Gynecology | Admitting: Obstetrics & Gynecology

## 2021-06-23 ENCOUNTER — Other Ambulatory Visit: Payer: Self-pay

## 2021-06-23 ENCOUNTER — Encounter (HOSPITAL_COMMUNITY): Payer: Self-pay | Admitting: Obstetrics and Gynecology

## 2021-06-23 DIAGNOSIS — Z3493 Encounter for supervision of normal pregnancy, unspecified, third trimester: Secondary | ICD-10-CM

## 2021-06-23 DIAGNOSIS — Z3A38 38 weeks gestation of pregnancy: Secondary | ICD-10-CM | POA: Diagnosis not present

## 2021-06-23 DIAGNOSIS — O471 False labor at or after 37 completed weeks of gestation: Secondary | ICD-10-CM | POA: Insufficient documentation

## 2021-06-23 DIAGNOSIS — O99210 Obesity complicating pregnancy, unspecified trimester: Secondary | ICD-10-CM

## 2021-06-23 DIAGNOSIS — Z3689 Encounter for other specified antenatal screening: Secondary | ICD-10-CM | POA: Diagnosis not present

## 2021-06-23 NOTE — MAU Note (Signed)
...  Angelica Cruz is a 25 y.o. at [redacted]w[redacted]d here in MAU reporting: CTX since 0200 this morning. She states she was able to sleep through them until around 0440. CTX currently 5-9 minutes apart, lasting one minute each. Rates CTX 7/10, lower abdominal. +FM. No VB or LOF.   GBS collected in office on the 19th but has not resulted yet. Marginal cord insertion.  Lab orders placed from triage: MAU Labor Evaluation

## 2021-06-23 NOTE — MAU Note (Signed)
.  I have communicated with Donette Larry, CNM and reviewed vital signs:  Vitals:   06/23/21 0730 06/23/21 0825  BP: 122/75 115/77  Pulse: (!) 109 (!) 102  Resp:  17  Temp:    SpO2: 100% 100%    Vaginal exam:  Dilation: 2 Effacement (%): 50 Cervical Position: Middle Station: -3 Presentation: Vertex Exam by:: Erle Crocker, RN,   Also reviewed contraction pattern and that non-stress test is reactive.  It has been documented that patient is contracting occasionally (18-19 minutes) with no cervical change over 1 hour not indicating active labor.  Patient denies any other complaints.  Based on this report provider has given order for discharge.  A discharge order and diagnosis entered by a provider.  Patient agreeable to plan.  Labor discharge instructions reviewed with patient.

## 2021-06-24 ENCOUNTER — Encounter: Payer: Self-pay | Admitting: Student

## 2021-06-24 DIAGNOSIS — R8271 Bacteriuria: Secondary | ICD-10-CM | POA: Insufficient documentation

## 2021-06-24 LAB — CULTURE, BETA STREP (GROUP B ONLY): Strep Gp B Culture: POSITIVE — AB

## 2021-06-27 ENCOUNTER — Telehealth: Payer: Self-pay | Admitting: Lactation Services

## 2021-06-27 NOTE — Telephone Encounter (Signed)
Returned patients call.   Patient reports when going to the bathroom she is having some gushes and then trickling and then gushes and the fluid is clear in the toilet. She feels like the fluid if coming out of her urethra.   Patient reports she had a contraction after using the bathroom for 1 minute that was about an 8/10 and then another one 5 minutes later, not as strong.   She is having intermittent contractions about 10 minutes apart that last about a minute, not lasting up to an hour.    She went to the MAU on Thursday and is dilated to 2 cm. She is feeling more pressure with contractions.   No vaginal discharge or mucous. No leaking of fluid when walking around and no leaking in underwear.   Patient has BPP tomorrow morning.   She is aware that if water breaks, she has increased pain, more that 6 contractions an  hour for more than an hour, of vaginal bleeding, she is to go to MAU for evaluation.   Patient voiced understanding.

## 2021-06-28 ENCOUNTER — Other Ambulatory Visit: Payer: Self-pay

## 2021-06-28 ENCOUNTER — Ambulatory Visit: Payer: Medicaid Other | Admitting: *Deleted

## 2021-06-28 ENCOUNTER — Ambulatory Visit (INDEPENDENT_AMBULATORY_CARE_PROVIDER_SITE_OTHER): Payer: Medicaid Other

## 2021-06-28 VITALS — BP 96/73 | HR 127 | Wt 226.8 lb

## 2021-06-28 DIAGNOSIS — Z3A39 39 weeks gestation of pregnancy: Secondary | ICD-10-CM

## 2021-06-28 DIAGNOSIS — O99210 Obesity complicating pregnancy, unspecified trimester: Secondary | ICD-10-CM

## 2021-06-28 NOTE — Progress Notes (Signed)

## 2021-07-01 ENCOUNTER — Encounter: Payer: Medicaid Other | Admitting: Family Medicine

## 2021-07-02 ENCOUNTER — Encounter (HOSPITAL_COMMUNITY): Payer: Self-pay | Admitting: Obstetrics & Gynecology

## 2021-07-02 ENCOUNTER — Other Ambulatory Visit: Payer: Self-pay

## 2021-07-02 ENCOUNTER — Inpatient Hospital Stay (HOSPITAL_COMMUNITY)
Admission: AD | Admit: 2021-07-02 | Discharge: 2021-07-02 | Disposition: A | Discharge status: Home / Self Care | Payer: Medicaid Other | Attending: Obstetrics & Gynecology | Admitting: Obstetrics & Gynecology

## 2021-07-02 DIAGNOSIS — O471 False labor at or after 37 completed weeks of gestation: Secondary | ICD-10-CM | POA: Diagnosis not present

## 2021-07-02 DIAGNOSIS — Z3A39 39 weeks gestation of pregnancy: Secondary | ICD-10-CM | POA: Insufficient documentation

## 2021-07-02 NOTE — Discharge Instructions (Signed)

## 2021-07-02 NOTE — MAU Note (Signed)
Pt presents with complaint of contractions q for the last 30 minutes. Repots good fetal movement. Denies bleeding or ROM.

## 2021-07-02 NOTE — MAU Provider Note (Signed)
S: [name] is a 25 y.o. G2P1001 at [redacted]w[redacted]d  who presents to MAU today complaining contractions q 5-10 minutes since this am. She denies vaginal bleeding. She denies LOF. She reports normal fetal movement.    O: BP 132/69 (BP Location: Right Arm)   Pulse 95   Temp 98 F (36.7 C) (Oral)   Resp 19   Ht 5\' 3"  (1.6 m)   Wt 102.1 kg   LMP 09/28/2020   SpO2 100%   BMI 39.86 kg/m  GENERAL: Well-developed, well-nourished female in no acute distress.  HEAD: Normocephalic, atraumatic.  CHEST: Normal effort of breathing, regular heart rate ABDOMEN: Soft, nontender, gravid  Cervical exam:  Dilation: 2 Effacement (%): 50 Cervical Position: Posterior Station: -2 Presentation: Vertex Exam by:: weston,rn   Fetal Monitoring: Baseline: 120 Variability: moderate Accelerations: 15x15 Decelerations: none Contractions: 5-10   A: SIUP at [redacted]w[redacted]d  False labor  P: -Discharge home in stable condition -Labor precautions discussed -Patient advised to follow-up with [facility] on Tuesday as scheduled for prenatal care -Patient may return to MAU as needed or if her condition were to change or worsen   Thursday, Rolm Bookbinder 07/02/2021 2:42 PM

## 2021-07-03 ENCOUNTER — Other Ambulatory Visit: Payer: Self-pay

## 2021-07-03 ENCOUNTER — Inpatient Hospital Stay (EMERGENCY_DEPARTMENT_HOSPITAL)
Admission: AD | Admit: 2021-07-03 | Discharge: 2021-07-04 | Disposition: A | Discharge status: Home / Self Care | Payer: Medicaid Other | Source: Home / Self Care | Attending: Obstetrics and Gynecology | Admitting: Obstetrics and Gynecology

## 2021-07-03 DIAGNOSIS — Z20822 Contact with and (suspected) exposure to covid-19: Secondary | ICD-10-CM | POA: Insufficient documentation

## 2021-07-03 DIAGNOSIS — Z3A39 39 weeks gestation of pregnancy: Secondary | ICD-10-CM | POA: Insufficient documentation

## 2021-07-03 DIAGNOSIS — Z3493 Encounter for supervision of normal pregnancy, unspecified, third trimester: Secondary | ICD-10-CM

## 2021-07-03 DIAGNOSIS — O99210 Obesity complicating pregnancy, unspecified trimester: Secondary | ICD-10-CM

## 2021-07-03 DIAGNOSIS — O26893 Other specified pregnancy related conditions, third trimester: Secondary | ICD-10-CM | POA: Insufficient documentation

## 2021-07-03 DIAGNOSIS — O471 False labor at or after 37 completed weeks of gestation: Secondary | ICD-10-CM

## 2021-07-03 DIAGNOSIS — N898 Other specified noninflammatory disorders of vagina: Secondary | ICD-10-CM | POA: Insufficient documentation

## 2021-07-04 ENCOUNTER — Encounter (HOSPITAL_COMMUNITY): Payer: Self-pay | Admitting: Obstetrics and Gynecology

## 2021-07-04 ENCOUNTER — Inpatient Hospital Stay (HOSPITAL_COMMUNITY)
Admission: AD | Admit: 2021-07-04 | Discharge: 2021-07-06 | DRG: 807 | Disposition: A | Discharge status: Home / Self Care | Payer: Medicaid Other | Attending: Family Medicine | Admitting: Family Medicine

## 2021-07-04 ENCOUNTER — Other Ambulatory Visit: Payer: Self-pay

## 2021-07-04 ENCOUNTER — Other Ambulatory Visit: Payer: Self-pay | Admitting: Student

## 2021-07-04 DIAGNOSIS — Z30017 Encounter for initial prescription of implantable subdermal contraceptive: Secondary | ICD-10-CM

## 2021-07-04 DIAGNOSIS — O358XX0 Maternal care for other (suspected) fetal abnormality and damage, not applicable or unspecified: Principal | ICD-10-CM | POA: Diagnosis present

## 2021-07-04 DIAGNOSIS — O43123 Velamentous insertion of umbilical cord, third trimester: Secondary | ICD-10-CM | POA: Diagnosis present

## 2021-07-04 DIAGNOSIS — O99214 Obesity complicating childbirth: Secondary | ICD-10-CM | POA: Diagnosis present

## 2021-07-04 DIAGNOSIS — Z3A39 39 weeks gestation of pregnancy: Secondary | ICD-10-CM | POA: Diagnosis not present

## 2021-07-04 DIAGNOSIS — O471 False labor at or after 37 completed weeks of gestation: Secondary | ICD-10-CM

## 2021-07-04 DIAGNOSIS — O26893 Other specified pregnancy related conditions, third trimester: Secondary | ICD-10-CM | POA: Diagnosis present

## 2021-07-04 DIAGNOSIS — O99210 Obesity complicating pregnancy, unspecified trimester: Secondary | ICD-10-CM

## 2021-07-04 DIAGNOSIS — O99824 Streptococcus B carrier state complicating childbirth: Secondary | ICD-10-CM | POA: Diagnosis present

## 2021-07-04 DIAGNOSIS — Z3493 Encounter for supervision of normal pregnancy, unspecified, third trimester: Secondary | ICD-10-CM

## 2021-07-04 DIAGNOSIS — Z20822 Contact with and (suspected) exposure to covid-19: Secondary | ICD-10-CM | POA: Diagnosis present

## 2021-07-04 DIAGNOSIS — O9902 Anemia complicating childbirth: Secondary | ICD-10-CM | POA: Diagnosis present

## 2021-07-04 LAB — CBC
HCT: 32.8 % — ABNORMAL LOW (ref 36.0–46.0)
Hemoglobin: 10 g/dL — ABNORMAL LOW (ref 12.0–15.0)
MCH: 25.6 pg — ABNORMAL LOW (ref 26.0–34.0)
MCHC: 30.5 g/dL (ref 30.0–36.0)
MCV: 84.1 fL (ref 80.0–100.0)
Platelets: 240 10*3/uL (ref 150–400)
RBC: 3.9 MIL/uL (ref 3.87–5.11)
RDW: 15.8 % — ABNORMAL HIGH (ref 11.5–15.5)
WBC: 10.5 10*3/uL (ref 4.0–10.5)
nRBC: 0 % (ref 0.0–0.2)

## 2021-07-04 LAB — POCT FERN TEST: POCT Fern Test: NEGATIVE

## 2021-07-04 LAB — TYPE AND SCREEN
ABO/RH(D): O POS
Antibody Screen: NEGATIVE

## 2021-07-04 LAB — SARS CORONAVIRUS 2 (TAT 6-24 HRS): SARS Coronavirus 2: NEGATIVE

## 2021-07-04 MED ORDER — OXYTOCIN BOLUS FROM INFUSION: 333.0000 mL | Freq: Once | INTRAVENOUS | Status: DC

## 2021-07-04 MED ORDER — ONDANSETRON HCL 4 MG/2ML IJ SOLN: 4.0000 mg | Freq: Four times a day (QID) | INTRAMUSCULAR | Status: DC | PRN

## 2021-07-04 MED ORDER — SODIUM CHLORIDE 0.9 % IV SOLN
2.0000 g | Freq: Once | INTRAVENOUS | Status: AC
  Administered 2021-07-04: 2 g via INTRAVENOUS
  Filled 2021-07-04: qty 2000

## 2021-07-04 MED ORDER — PRENATAL MULTIVITAMIN CH
1.0000 | ORAL_TABLET | Freq: Every day | ORAL | Status: DC
  Administered 2021-07-05 – 2021-07-06 (×2): 1 via ORAL
  Filled 2021-07-04 (×2): qty 1

## 2021-07-04 MED ORDER — PRENATAL VITAMINS 28-0.8 MG PO TABS: ORAL_TABLET | Freq: Every day | ORAL | Status: DC

## 2021-07-04 MED ORDER — ACETAMINOPHEN 325 MG PO TABS
650.0000 mg | ORAL_TABLET | Freq: Four times a day (QID) | ORAL | Status: DC
  Administered 2021-07-04 – 2021-07-06 (×8): 650 mg via ORAL
  Filled 2021-07-04 (×8): qty 2

## 2021-07-04 MED ORDER — COCONUT OIL OIL: 1.0000 "application " | TOPICAL_OIL | Status: DC | PRN

## 2021-07-04 MED ORDER — SIMETHICONE 80 MG PO CHEW: 80.0000 mg | CHEWABLE_TABLET | ORAL | Status: DC | PRN

## 2021-07-04 MED ORDER — LACTATED RINGERS IV SOLN: 500.0000 mL | INTRAVENOUS | Status: DC | PRN

## 2021-07-04 MED ORDER — SENNOSIDES-DOCUSATE SODIUM 8.6-50 MG PO TABS
2.0000 | ORAL_TABLET | ORAL | Status: DC
  Administered 2021-07-04 – 2021-07-05 (×2): 2 via ORAL
  Filled 2021-07-04 (×2): qty 2

## 2021-07-04 MED ORDER — OXYCODONE-ACETAMINOPHEN 5-325 MG PO TABS: 1.0000 | ORAL_TABLET | ORAL | Status: DC | PRN

## 2021-07-04 MED ORDER — IBUPROFEN 600 MG PO TABS
600.0000 mg | ORAL_TABLET | Freq: Four times a day (QID) | ORAL | Status: DC
  Administered 2021-07-04 – 2021-07-06 (×8): 600 mg via ORAL
  Filled 2021-07-04 (×8): qty 1

## 2021-07-04 MED ORDER — DIBUCAINE (PERIANAL) 1 % EX OINT: 1.0000 "application " | TOPICAL_OINTMENT | CUTANEOUS | Status: DC | PRN

## 2021-07-04 MED ORDER — ONDANSETRON HCL 4 MG/2ML IJ SOLN: 4.0000 mg | INTRAMUSCULAR | Status: DC | PRN

## 2021-07-04 MED ORDER — WITCH HAZEL-GLYCERIN EX PADS: 1.0000 "application " | MEDICATED_PAD | CUTANEOUS | Status: DC | PRN

## 2021-07-04 MED ORDER — OXYTOCIN-SODIUM CHLORIDE 30-0.9 UT/500ML-% IV SOLN
2.5000 [IU]/h | INTRAVENOUS | Status: DC
  Filled 2021-07-04: qty 500

## 2021-07-04 MED ORDER — TETANUS-DIPHTH-ACELL PERTUSSIS 5-2.5-18.5 LF-MCG/0.5 IM SUSY: 0.5000 mL | PREFILLED_SYRINGE | Freq: Once | INTRAMUSCULAR | Status: DC

## 2021-07-04 MED ORDER — OXYTOCIN BOLUS FROM INFUSION
333.0000 mL | Freq: Once | INTRAVENOUS | Status: AC
  Administered 2021-07-04: 333 mL via INTRAVENOUS

## 2021-07-04 MED ORDER — OXYCODONE-ACETAMINOPHEN 5-325 MG PO TABS: 2.0000 | ORAL_TABLET | ORAL | Status: DC | PRN

## 2021-07-04 MED ORDER — ACETAMINOPHEN 325 MG PO TABS: 650.0000 mg | ORAL_TABLET | ORAL | Status: DC | PRN

## 2021-07-04 MED ORDER — BENZOCAINE-MENTHOL 20-0.5 % EX AERO
1.0000 "application " | INHALATION_SPRAY | CUTANEOUS | Status: DC | PRN
  Administered 2021-07-04: 1 via TOPICAL
  Filled 2021-07-04: qty 56

## 2021-07-04 MED ORDER — ONDANSETRON HCL 4 MG PO TABS: 4.0000 mg | ORAL_TABLET | ORAL | Status: DC | PRN

## 2021-07-04 MED ORDER — LACTATED RINGERS IV SOLN: INTRAVENOUS | Status: DC

## 2021-07-04 MED ORDER — DIPHENHYDRAMINE HCL 25 MG PO CAPS: 25.0000 mg | ORAL_CAPSULE | Freq: Four times a day (QID) | ORAL | Status: DC | PRN

## 2021-07-04 MED ORDER — SODIUM CHLORIDE 0.9 % IV SOLN: 1.0000 g | INTRAVENOUS | Status: DC

## 2021-07-04 MED ORDER — FENTANYL CITRATE (PF) 100 MCG/2ML IJ SOLN
50.0000 ug | INTRAMUSCULAR | Status: DC | PRN
  Administered 2021-07-04: 100 ug via INTRAVENOUS
  Filled 2021-07-04 (×2): qty 2

## 2021-07-04 MED ORDER — LIDOCAINE HCL (PF) 1 % IJ SOLN
30.0000 mL | INTRAMUSCULAR | Status: DC | PRN
Start: 2021-07-04 — End: 2021-07-04

## 2021-07-04 MED ORDER — SOD CITRATE-CITRIC ACID 500-334 MG/5ML PO SOLN: 30.0000 mL | ORAL | Status: DC | PRN

## 2021-07-04 NOTE — MAU Note (Signed)
Pt presents to MAU c/o questionable ROM after having intercourse around 2220.  Pt denies vaginal bleeding.  +FM

## 2021-07-04 NOTE — Plan of Care (Signed)
Jewett Mcgann, RN 

## 2021-07-04 NOTE — Lactation Note (Signed)
This note was copied from a baby's chart. Lactation Consultation Note  Patient Name: Angelica Cruz [id number] Date: 07/04/2021 Reason for consult: L&D Initial assessment;Mother's request;Term Age:24 hours, term female infant with one void. Per mom, infant already BF 4 times since delivery and she is BF well only feel tug and no pain with latch, infant is BF for 20 to 30 minutes. LC did not observe latch, per mom, infant recently BF and is asleep in bassinet. Mom knows to BF infant according to hunger cues, 8 to 12 or more times within 24 hours, BF infant skin to skin. Mom knows to call RN or LC if she has any BF questions, concerns or need assistance with latching infant at the breast.  LC discussed infant's input and output with parents. Mom will get adequate rest, drink to thirst and follow balance diet.  Mom made aware of O/P services, breastfeeding support groups, community resources, and our phone # for post-discharge questions.   Maternal Data Has patient been taught Hand Expression?: Yes Does the patient have breastfeeding experience prior to this delivery?: Yes How long did the patient breastfeed?: 3 weeks  Feeding Mother's Current Feeding Choice: Breast Milk  LATCH Score              Lactation Tools Discussed/Used    Interventions Interventions: Breast feeding basics reviewed;Support pillows;Education;Assisted with latch;Skin to skin;Expressed milk;Breast massage;Hand express;Breast compression;Adjust position  Discharge Pump: Personal WIC Program: Yes  Consult Status Consult Status: Follow-up Date: 07/05/21 Follow-up type: In-patient    [name] 07/04/2021, 4:20 PM

## 2021-07-04 NOTE — MAU Provider Note (Signed)
None      S: [name] is a 25 y.o. G2P1001 at [redacted]w[redacted]d  who presents to MAU today complaining of leaking of fluid since 2230. She denies vaginal bleeding. She endorses contractions. She reports normal fetal movement.    O: BP 126/81 (BP Location: Right Arm)   Pulse 84   Temp 98.4 F (36.9 C) (Oral)   Resp 17   Ht 5\' 3"  (1.6 m)   Wt 103.5 kg   LMP 09/28/2020   SpO2 100%   BMI 40.42 kg/m  GENERAL: Well-developed, well-nourished female in no acute distress.  HEAD: Normocephalic, atraumatic.  CHEST: Normal effort of breathing, regular heart rate ABDOMEN: Soft, nontender, gravid PELVIC: Normal external female genitalia. Vagina is pink and rugated. Cervix with normal contour, no lesions. Normal discharge.  No pooling.   Cervical exam:  Dilation: 2 Effacement (%): 50, 60 Cervical Position: Middle Station: -3, Ballotable Presentation: Vertex Exam by:: K Torphy, RN   Fetal Monitoring: Baseline: 140 bpm Variability: mod Accelerations: present Decelerations: absent Contractions: q3-4  Results for orders placed or performed during the hospital encounter of 07/03/21 (from the past 24 hour(s))  POCT fern test     Status: None   Collection Time: 07/04/21 12:46 AM  Result Value Ref Range   POCT Fern Test Negative = intact amniotic membranes      A: SIUP at [redacted]w[redacted]d  Membranes intact  P: PLan for discharge home -postdates IOL scheduled for Aug 2nd, per patient request. Patient understands that she may get moved to a later date if L and D is full/more acute patients need to be admitted ahead of her -will send message to her provider on Tuesday that she will KEEP her appt in case her IOL is canceled  -will do COVID swab here in MAU -IOL orders placed -warning signs of labor reviewed Thursday, CNM 07/04/2021 2:38 AM

## 2021-07-04 NOTE — H&P (Addendum)
OBSTETRIC ADMISSION HISTORY AND PHYSICAL  Angelica Cruz is a 25 y.o. female G2P1001 with IUP at [redacted]w[redacted]d by LMP presenting for labor. She reports +FMs, No LOF, no VB, no blurry vision, headaches or peripheral edema, and RUQ pain.  She plans on breast feeding. She request Nexplanon for birth control. She received her prenatal care at [facility]   Dating: By LMP --->  Estimated Date of Delivery: 07/05/21  Sono:    @[redacted]w[redacted]d , CWD, normal anatomy, cephalic presentation, Posterior Placenta, 3369g, 74% EFW   Prenatal History/Complications:  -maternal Obesity -chlamydia during pregnancy -Marginal cord insertion  Past Medical History: Past Medical History:  Diagnosis Date   Chlamydia    Obesity     Past Surgical History: Past Surgical History:  Procedure Laterality Date   NO PAST SURGERIES      Obstetrical History: OB History     Gravida  2   Para  1   Term  1   Preterm  0   AB  0   Living  1      SAB  0   IAB  0   Ectopic  0   Multiple  0   Live Births  1           Social History Social History   Socioeconomic History   Marital status: Single    Spouse name: Not on file   Number of children: Not on file   Years of education: Not on file   Highest education level: Not on file  Occupational History   Not on file  Tobacco Use   Smoking status: Never   Smokeless tobacco: Never  Vaping Use   Vaping Use: Never used  Substance and Sexual Activity   Alcohol use: Not Currently    Comment: occasionally   Drug use: No   Sexual activity: Yes  Other Topics Concern   Not on file  Social History Narrative   Not on file   Social Determinants of Health   Financial Resource Strain: Not on file  Food Insecurity: No Food Insecurity   Worried About Running Out of Food in the Last Year: Never true   Ran Out of Food in the Last Year: Never true  Transportation Needs: No Transportation Needs   Lack of Transportation (Medical): No   Lack of Transportation  (Non-Medical): No  Physical Activity: Not on file  Stress: Not on file  Social Connections: Not on file    Family History: Family History  Problem Relation Age of Onset   Hypertension Mother     Allergies: No Known Allergies  Medications Prior to Admission  Medication Sig Dispense Refill Last Dose   Prenatal Vit-Fe Fumarate-FA (PRENATAL VITAMINS PO) Take 1 tablet by mouth daily.        Review of Systems   All systems reviewed and negative except as stated in HPI  Blood pressure 113/74, pulse (!) 101, temperature 98 F (36.7 C), temperature source Oral, resp. rate 18, last menstrual period 09/28/2020, SpO2 100 %. General appearance: alert and cooperative Lungs: clear to auscultation bilaterally Heart: regular rate and rhythm Abdomen: soft, non-tender; bowel sounds normal Pelvic: 8/90/-2 Extremities: Homans sign is negative, no sign of DVT Presentation: cephalic Fetal monitoringBaseline: 140 bpm, Variability: Good {> 6 bpm), Accelerations: Reactive, and Decelerations: Absent Uterine activity: q2 min Dilation: 5 Effacement (%): 80 Station: -2 Exam by:: [email or web address], RN   Prenatal labs: ABO, Rh: --/--/PENDING (08/01 0906)O positive Antibody: PENDING (08/01 0906)negative Rubella: 1.18 (02/03 1128)  RPR: Non Reactive (05/02 0914)  HBsAg: Negative (02/03 1128)  HIV: Non Reactive (05/02 0914)  GBS: Positive/-- (07/19 1019)  1 hr Glucola : 81, 100 Genetic screening  Low Risk Anatomy US Normal except FUP left Kidney  Prenatal Transfer Tool  Maternal Diabetes: No Genetic Screening: Normal Maternal Ultrasounds/Referrals: Fetal Kidney Anomalies Fetal Ultrasounds or other Referrals:  None Maternal Substance Abuse:  No Significant Maternal Medications:  None Significant Maternal Lab Results: Group B Strep negative  Results for orders placed or performed during the hospital encounter of 07/04/21 (from the past 24 hour(s))  Type and screen   Collection Time: 07/04/21   9:06 AM  Result Value Ref Range   ABO/RH(D) PENDING    Antibody Screen PENDING    Sample Expiration      07/07/2021,2359 Performed at [facility], [address] [place], [place] [address]   Results for orders placed or performed during the hospital encounter of 07/03/21 (from the past 24 hour(s))  POCT fern test   Collection Time: 07/04/21 12:46 AM  Result Value Ref Range   POCT Fern Test Negative = intact amniotic membranes     Patient Active Problem List   Diagnosis Date Noted   Indication for care in labor and delivery, antepartum 07/04/2021   Group B streptococcal bacteriuria 06/24/2021   Anemia affecting pregnancy in second trimester 04/06/2021   Supervision of low-risk pregnancy 12/13/2020   Obesity during pregnancy 12/13/2020   Trichomoniasis 10/18/2020   Chlamydia infection affecting pregnancy 09/01/2017    Assessment/Plan:  Angelica Cruz is a 25 y.o. G2P1001 at [redacted]w[redacted]d here for labor  #Labor:Expectant management, progressing well #Pain: Epidural as desired #FWB: Cat 1 #ID:  GBS positive, ampicillin given at 1038 due to presenting in active labor #MOF: Breast #MOC:Nexplanon inpatient #Circ:  NA/Female  # Anemia- Hgb 10.0 on admission.  Nelson Chimes, MD  07/04/2021, 9:31 AM   Attestation of Supervision of Resident:  I confirm that I have verified the information documented in the resident's note and that I have also personally performed the history, physical exam and all medical decision making activities.  I have verified that all services and findings are accurately documented in this student's note; and I agree with management and plan as outlined in the documentation. I have also made any necessary editorial changes.  Billey Co, MD Center for [facility], [facility] 07/04/2021 11:49 AM   Attestation of Attending Supervision of Obstetric Fellow: Evaluation and management procedures were performed by the Obstetric  Fellow under my supervision and collaboration.  I have reviewed the Obstetric Fellow's note and chart, and I agree with the management and plan. I have also made any necessary editorial changes.   Venora Maples, MD Family Medicine Attending, [facility] for [facility], [facility] 07/06/2021 7:25 AM

## 2021-07-04 NOTE — MAU Note (Signed)
Angelica Cruz is a 25 y.o. at [redacted]w[redacted]d here in MAU reporting: increased contractions since membrane sweep around 0430. They are every 3 min. No bleeding or LOF. +FM  Onset of complaint: ongoing  Pain score: 8/10  Vitals:   07/04/21 0725  BP: 124/77  Pulse: 94  Resp: 18  Temp: 98 F (36.7 C)  SpO2: 100%     FHT:+FM, pt wearing a form fitted dress so doppler deferred  Lab orders placed from triage:none

## 2021-07-04 NOTE — Discharge Instructions (Signed)
New Induction of Labor Process for [facility]  In Fall 2020 [facility] changed it's process for scheduling inductions of labor to create more induction slots and to make sure patients get COVID-19 testing in advance. After you have been tested you need to quarantine so that you do not get infected after your test. You should not go anywhere after your test except necessary medical appointments.  You have been scheduled for induction of labor on 07-05-2021. Although you may have a specific time listed on your After Visit Summary or MyChart, we cannot predict when your room will be available. Please disregard this time. A Labor and Delivery staff member will call you on the day that you are scheduled when your room is available. You will need to arrive within one hour of being called. If you do not arrive within this time frame, the next person on the list will be called in and you will move down the list. You may eat a light meal before coming to the hospital. If you go into labor, think your water has broken, experience bright red bleeding or don't feel your baby moving as much as usual before your induction, please call your Ob/Gyn's office or come to Entrance C, Maternity Assessment Unit for evaluation.  Thank you,  [facility] [employer] [facility]

## 2021-07-04 NOTE — Discharge Summary (Signed)
Postpartum Discharge Summary     Patient Name: Angelica Cruz DOB: Nov 22, 1996 MRN: [id number]  Date of admission: 07/04/2021 Delivery date:07/04/2021  Delivering provider: Genia Del  Date of discharge: 07/06/2021  Admitting diagnosis: Indication for care in labor and delivery, antepartum [O75.9] Intrauterine pregnancy: [redacted]w[redacted]d    Secondary diagnosis:  Active Problems:   Indication for care in labor and delivery, antepartum  Additional problems: None    Discharge diagnosis: Term Pregnancy Delivered                                              Post partum procedures: Nexplanon insertion Augmentation: N/A Complications: None  Hospital course: Onset of Labor With Vaginal Delivery      25y.o. yo G2P1001 at 340w6das admitted in Active Labor on 07/04/2021. Patient had an uncomplicated labor course as follows: She was admitted at 5 cm in spontaneous labor and progressed to complete. Bulging bag of membranes ruptured just prior to delivery as she was pushing involuntarily, clear fluid. Membrane Rupture Time/Date: 12:12 PM ,07/04/2021   Delivery Method:Vaginal, Spontaneous  Episiotomy: None  None Lacerations:  None  None, small vaginal abrasion Patient had an uncomplicated postpartum course.  She is ambulating, tolerating a regular diet, passing flatus, and urinating well. Patient is discharged home in stable condition on 07/06/21.  Newborn Data: Birth date:07/04/2021  Birth time:12:16 PM  Gender:Female  Living status:Living  Apgars:9 ,9  Weight:3351 g   Magnesium Sulfate received: No BMZ received: No Rhophylac:N/A MMR:N/A T-DaP:Given prenatally Flu: N/A Transfusion:No  Physical exam  Vitals:   07/05/21 0509 07/05/21 1524 07/05/21 2137 07/06/21 0513  BP: 111/77 116/70 129/78 104/66  Pulse: 62 80 67 72  Resp: '18 17 18 18  ' Temp: 97.9 F (36.6 C) 97.8 F (36.6 C) 97.7 F (36.5 C) 98.3 F (36.8 C)  TempSrc: Oral Oral Oral Oral  SpO2: 100% 97% 99% 100%  Height:        General: alert, cooperative, and no distress Lochia: appropriate Uterine Fundus: firm Incision: N/A DVT Evaluation: No evidence of DVT seen on physical exam. Labs: Lab Results  Component Value Date   WBC 10.5 07/04/2021   HGB 10.0 (L) 07/04/2021   HCT 32.8 (L) 07/04/2021   MCV 84.1 07/04/2021   PLT 240 07/04/2021   Edinburgh Score: Edinburgh Postnatal Depression Scale Screening Tool 07/05/2021  I have been able to laugh and see the funny side of things. 0  I have looked forward with enjoyment to things. 0  I have blamed myself unnecessarily when things went wrong. 0  I have been anxious or worried for no good reason. 0  I have felt scared or panicky for no good reason. 0  Things have been getting on top of me. 0  I have been so unhappy that I have had difficulty sleeping. 0  I have felt sad or miserable. 0  I have been so unhappy that I have been crying. 0  The thought of harming myself has occurred to me. 0  Edinburgh Postnatal Depression Scale Total 0     After visit meds:  Allergies as of 07/06/2021   No Known Allergies      Medication List     TAKE these medications    ibuprofen 600 MG tablet Commonly known as: ADVIL Take 1 tablet (600 mg total) by mouth  every 6 (six) hours.   PRENATAL VITAMINS PO Take 1 tablet by mouth daily.         Discharge home in stable condition Infant Feeding: Breast Infant Disposition:home with mother Discharge instruction: per After Visit Summary and Postpartum booklet. Activity: Advance as tolerated. Pelvic rest for 6 weeks.  Diet: routine diet Future Appointments: No future appointments.  Follow up Visit:  [facility] [place] [facility] Follow up in 4 week(s).   Specialty: Obstetrics and Gynecology Why: They will call you with an appointment Contact information: [place] [id number] [phone]                Emailed to schedulers on 07/04/21 by Dr Thompson Grayer  Please schedule this patient for a In person postpartum visit in 6 weeks with the following provider: Any provider. Additional Postpartum F/U: None   Low risk pregnancy complicated by:  None Delivery mode:  Vaginal, Spontaneous  Anticipated Birth Control:  PP Nexplanon placed   07/06/2021 Kerry Hough, PA-C

## 2021-07-04 NOTE — Lactation Note (Signed)
This note was copied from a baby's chart. Lactation Consultation Note  Patient Name: Girl Azalea Cedar [id number] Date: 07/04/2021 Reason for consult: L&D Initial assessment;Mother's request;Term Age:24 hours  LC assisted mother latching infant in cross cradle on left breast. Infant showing signs of milk transfer. Mom to offer eBM via finger or drops at breast to assist with latching.  Further LC support to be provided on the floor.   Maternal Data Has patient been taught Hand Expression?: Yes Does the patient have breastfeeding experience prior to this delivery?: Yes How long did the patient breastfeed?: 3 weeks  Feeding Mother's Current Feeding Choice: Breast Milk  LATCH Score Latch: Repeated attempts needed to sustain latch, nipple held in mouth throughout feeding, stimulation needed to elicit sucking reflex.  Audible Swallowing: A few with stimulation  Type of Nipple: Everted at rest and after stimulation  Comfort (Breast/Nipple): Soft / non-tender  Hold (Positioning): Assistance needed to correctly position infant at breast and maintain latch.  LATCH Score: 7   Lactation Tools Discussed/Used    Interventions Interventions: Breast feeding basics reviewed;Support pillows;Education;Assisted with latch;Skin to skin;Expressed milk;Breast massage;Hand express;Breast compression;Adjust position  Discharge Pump: Personal WIC Program: Yes  Consult Status Consult Status: Follow-up Date: 07/05/21 Follow-up type: In-patient    Tawanda Schall  Nicholson-Springer 07/04/2021, 12:52 PM

## 2021-07-05 ENCOUNTER — Encounter: Payer: Medicaid Other | Admitting: Nurse Practitioner

## 2021-07-05 ENCOUNTER — Encounter (HOSPITAL_COMMUNITY): Payer: Medicaid Other

## 2021-07-05 DIAGNOSIS — Z30017 Encounter for initial prescription of implantable subdermal contraceptive: Secondary | ICD-10-CM | POA: Diagnosis not present

## 2021-07-05 LAB — RPR: RPR Ser Ql: NONREACTIVE

## 2021-07-05 MED ORDER — LIDOCAINE HCL 1 % IJ SOLN
0.0000 mL | Freq: Once | INTRAMUSCULAR | Status: AC | PRN
  Administered 2021-07-05: 20 mL via INTRADERMAL
  Filled 2021-07-05: qty 20

## 2021-07-05 MED ORDER — IBUPROFEN 600 MG PO TABS: 600.0000 mg | ORAL_TABLET | Freq: Four times a day (QID) | ORAL | 0 refills | Status: DC

## 2021-07-05 MED ORDER — ETONOGESTREL 68 MG ~~LOC~~ IMPL
68.0000 mg | DRUG_IMPLANT | Freq: Once | SUBCUTANEOUS | Status: AC
  Administered 2021-07-05: 68 mg via SUBCUTANEOUS
  Filled 2021-07-05: qty 1

## 2021-07-05 NOTE — Progress Notes (Signed)
POSTPARTUM PROGRESS NOTE  Post Partum Day 1  Subjective:  Angelica Cruz is a 25 y.o. [id number] s/p SVD at [redacted]w[redacted]d.  She reports she is doing well. No acute events overnight. She denies any problems with ambulating, voiding or po intake. Denies nausea or vomiting.  Pain is well controlled.  Lochia is mild.  Objective: Blood pressure 111/77, pulse 62, temperature 97.9 F (36.6 C), temperature source Oral, resp. rate 18, height 5\' 3"  (1.6 m), last menstrual period 09/28/2020, SpO2 100 %, unknown if currently breastfeeding.  Physical Exam:  General: alert, cooperative and no distress Chest: no respiratory distress Heart:regular rate, distal pulses intact Abdomen: soft, nontender,  Uterine Fundus: firm, appropriately tender DVT Evaluation: No calf swelling or tenderness Extremities: no edema Skin: warm, dry  Recent Labs    07/04/21 0906  HGB 10.0*  HCT 32.8*    Assessment/Plan: Angelica Cruz is a 25 y.o. 30 s/p SVD at [redacted]w[redacted]d   PPD#1 - Doing well  Routine postpartum care  Contraception: Nexplanon Feeding: Breast Dispo: Plan for discharge PPD1.   LOS: 1 day   [redacted]w[redacted]d MD  07/05/2021, 7:54 AM  CNM attestation Post Partum Day #1 I have seen and examined this patient and agree with above documentation in the resident's note.   Angelica Cruz is a 25 y.o. [phone] s/p vag del.  Pt denies problems with ambulating, voiding or po intake. Pain is well controlled.  Plan for birth control is  would like Nexplanon placed prior to d/c .  Method of Feeding: both  PE:  BP 111/77 (BP Location: Right Arm)   Pulse 62   Temp 97.9 F (36.6 C) (Oral)   Resp 18   Ht 5\' 3"  (1.6 m)   LMP 09/28/2020   SpO2 100%   Breastfeeding Unknown   BMI 40.42 kg/m  Fundus firm  Plan for discharge:  -She would like to go home today, however she only received one dose of Amp for GBS ppx prior to delivery, so it is unlikely that peds will recommend the baby for discharge on  PPD#1. -Nexplanon to be placed later today per her request  , CNM 1:30 PM  07/05/2021

## 2021-07-05 NOTE — Procedures (Signed)
Post-Placental Nexplanon Insertion Procedure Note  Patient was identified. Informed consent was signed, signed copy in chart. A time-out was performed.    The insertion site was identified 8-10 cm (3-4 inches) from the medial epicondyle of the humerus and 3-5 cm (1.25-2 inches) posterior to (below) the sulcus (groove) between the biceps and triceps muscles of the patient's L arm and marked. The site was prepped and draped in the usual sterile fashion. Pt was prepped with alcohol swab and then injected with 5 cc of 1% lidocaine. The site was prepped with betadine. Nexplanon removed form packaging,  Device confirmed in needle, then inserted full length of needle and withdrawn per handbook instructions. Provider and patient verified presence of the implant in the woman's arm by palpation. Pt insertion site was covered with steristrips/adhesive bandage and pressure bandage. There was minimal blood loss. Patient tolerated procedure well.  Patient was given post procedure instructions and Nexplanon user card with expiration date. Condoms were recommended for STI prevention. Patient was asked to keep the pressure dressing on for 24 hours to minimize bruising and keep the adhesive bandage on for 3-5 days. The patient verbalized understanding of the plan of care and agrees.   Expiration Date 10/12/2024  Shirlean Mylar, MD [facility], PGY-3

## 2021-07-05 NOTE — Progress Notes (Signed)
Plan of care told to patient. Pain meds explained, emergency call bell explained. Patient states she has no complaints at this time. Encourage to feed infant and update yellow sheet.

## 2021-07-06 NOTE — Lactation Note (Signed)
This note was copied from a baby's chart. Lactation Consultation Note  Patient Name: Angelica Cruz Date: 07/06/2021 Reason for consult: Follow-up assessment Age:23 hours Mother denies any discomforts with breastfeeding.  She was given a harmony hand pump.  Mother reports that she plans to breastfeed and will pump and bottle fed some when she gets home.  Mother reports that she has a DEBP at home. Discussed treatment and prevention of engorgment.  Mother advised to follow up with [facility] resources as needed.  Maternal Data    Feeding Mother's Current Feeding Choice: Breast Milk  LATCH Score                    Lactation Tools Discussed/Used Tools: Flanges;Pump Flange Size: 24 Breast pump type: Manual Pump Education: Setup, frequency, and cleaning;Milk Storage Reason for Pumping: mother reports that she will pump and bottle feed some when she gets home. Pumping frequency: pre pump when filling  Interventions    Discharge Discharge Education: Engorgement and breast care;Warning signs for feeding baby  Consult Status Consult Status: Complete    Michel Bickers 07/06/2021, 2:18 PM

## 2021-07-06 NOTE — Lactation Note (Signed)
This note was copied from a baby's chart. Lactation Consultation Note Mom requesting to see Lactation. Rn has helped mom and talked to mom about newborn behavior and feeding habits and cluster feeding. Mom wanting formula. LC spoke w/mom reviewing newborn feeding habits. Mom feels that she doesn't have enough milk and request formula.  Hand expressed colostrum easily. Praised mom. Mom still feels that baby needs formula. Mom stated she has WIC and will get formula from [facility] to supplement with. Gave mom supplementation sheet for BF. Reviewed milk storage and breast massage to give baby more during BF.  Pace feeding reviewed.   Patient Name: Angelica Cruz [id number] Date: 07/06/2021 Reason for consult: Mother's request;Term Age:75 hours  Maternal Data Has patient been taught Hand Expression?: Yes  Feeding Mother's Current Feeding Choice: Breast Milk and Formula  LATCH Score Latch: Grasps breast easily, tongue down, lips flanged, rhythmical sucking.  Audible Swallowing: A few with stimulation  Type of Nipple: Everted at rest and after stimulation  Comfort (Breast/Nipple): Soft / non-tender  Hold (Positioning): No assistance needed to correctly position infant at breast.  LATCH Score: 9   Lactation Tools Discussed/Used    Interventions Interventions: Breast feeding basics reviewed;Support pillows;Assisted with latch;Position options;Breast massage;Hand express;Breast compression  Discharge    Consult Status Consult Status: Follow-up Date: 07/06/21 Follow-up type: In-patient    Angelica Cruz, [name] 07/06/2021, 2:50 AM

## 2021-07-16 ENCOUNTER — Telehealth (HOSPITAL_COMMUNITY): Payer: Self-pay

## 2021-07-16 NOTE — Telephone Encounter (Signed)
"  I'm doing well." Patient has no questions or concerns about her healing.  "She's doing good. She had her doctor appointment and she gained weight. She eats good. She sleeps in her bassinet."RN reviewed ABC's of safe sleep with patient. Patient declines any questions or concerns about baby.  EPDS score is 0.  Marcelino Duster [facility] 07/16/2021,1524

## 2021-08-11 ENCOUNTER — Ambulatory Visit (INDEPENDENT_AMBULATORY_CARE_PROVIDER_SITE_OTHER): Payer: Medicaid Other | Admitting: Student

## 2021-08-11 ENCOUNTER — Other Ambulatory Visit: Payer: Self-pay

## 2021-08-11 NOTE — Progress Notes (Signed)
    Post Partum Visit Note  Angelica Cruz is a 25 y.o. G12P2002 female who presents for a postpartum visit. She is 5 weeks postpartum following a normal spontaneous vaginal delivery.  I have fully reviewed the prenatal and intrapartum course. The delivery was at 39.6 gestational weeks.  Anesthesia: IV sedation. Postpartum course has been uneventful. Baby is doing well. Baby is feeding by bottle - Angelica Cruz . Bleeding no bleeding. Bowel function is normal. Bladder function is normal. Patient is sexually active. Contraception method is Nexplanon. Postpartum depression screening: negative.   The pregnancy intention screening data noted above was reviewed. Potential methods of contraception were discussed. The patient elected to proceed with No data recorded.    Health Maintenance Due  Topic Date Due   COVID-19 Vaccine (1) Never done   HPV VACCINES (1 - 2-dose series) Never done   INFLUENZA VACCINE  07/04/2021    The following portions of the patient's history were reviewed and updated as appropriate: allergies, current medications, past family history, past medical history, past social history, past surgical history, and problem list.  Review of Systems Pertinent items are noted in HPI.  Objective:  LMP 09/28/2020    General:  alert, cooperative, and no distress   Breasts:  Not done  Lungs: clear to auscultation bilaterally  Heart:  regular rate and rhythm, S1, S2 normal, no murmur, click, rub or gallop  Abdomen: soft, non-tender; bowel sounds normal; no masses,  no organomegaly   Wound located on the NA  GU exam:   NA       Assessment:   1. Postpartum care and examination    -Patient doing well; no concerns    Healthy postpartum exam.   Plan:   Essential components of care per ACOG recommendations:  1.  Mood and well being: Patient with negative depression screening today. Reviewed local resources for support.  - Patient tobacco use? No.   - hx of drug use?  No.    2. Infant care and feeding:  -Patient currently breastmilk feeding? No.  -Social determinants of health (SDOH) reviewed in EPIC. No concerns  3. Sexuality, contraception and birth spacing - Patient does not want a pregnancy in the next year.  Desired family size is 2 children.  - Reviewed forms of contraception in tiered fashion. Patient desired Nexplanon, which was placed in hospital. oday.   - Discussed birth spacing of 18 months  4. Sleep and fatigue -Encouraged family/partner/community support of 4 hrs of uninterrupted sleep to help with mood and fatigue  5. Physical Recovery  - Discussed patients delivery and complications. She describes her labor as good. - Patient had a Vaginal, no problems at delivery. Patient had a  None  laceration. Perineal healing reviewed. Patient expressed understanding - Patient has urinary incontinence? No. - Patient is safe to resume physical and sexual activity  6.  Health Maintenance - HM due items addressed None - Last pap smear  Diagnosis  Date Value Ref Range Status  10/13/2020   Final   - Negative for intraepithelial lesion or malignancy (NILM)   Pap smear not done at today's visit.  -Breast Cancer screening indicated? No.   7. Chronic Disease/Pregnancy Condition follow up: None  - PCP follow up  Guy Begin, CMA [facility] [employer], [facility]

## 2021-09-09 ENCOUNTER — Encounter: Payer: Self-pay | Admitting: Radiology

## 2021-10-03 ENCOUNTER — Encounter: Payer: Self-pay | Admitting: *Deleted

## 2022-04-07 ENCOUNTER — Encounter: Payer: Self-pay | Admitting: Student

## 2022-05-21 ENCOUNTER — Ambulatory Visit
Admission: RE | Admit: 2022-05-21 | Discharge: 2022-05-21 | Disposition: A | Discharge status: Home / Self Care | Payer: Medicaid Other | Source: Ambulatory Visit | Attending: Emergency Medicine | Admitting: Emergency Medicine

## 2022-05-21 VITALS — BP 116/81 | HR 80 | Temp 98.0°F | Resp 20

## 2022-05-21 DIAGNOSIS — J309 Allergic rhinitis, unspecified: Secondary | ICD-10-CM | POA: Insufficient documentation

## 2022-05-21 DIAGNOSIS — J039 Acute tonsillitis, unspecified: Secondary | ICD-10-CM | POA: Diagnosis present

## 2022-05-21 DIAGNOSIS — H66001 Acute suppurative otitis media without spontaneous rupture of ear drum, right ear: Secondary | ICD-10-CM | POA: Diagnosis present

## 2022-05-21 LAB — POCT RAPID STREP A (OFFICE): Rapid Strep A Screen: NEGATIVE

## 2022-05-21 MED ORDER — IBUPROFEN 600 MG PO TABS: 600.0000 mg | ORAL_TABLET | Freq: Three times a day (TID) | ORAL | 0 refills | Status: DC | PRN

## 2022-05-21 MED ORDER — FLUCONAZOLE 150 MG PO TABS: ORAL_TABLET | ORAL | 0 refills | Status: DC

## 2022-05-21 MED ORDER — CEFDINIR 300 MG PO CAPS: 300.0000 mg | ORAL_CAPSULE | Freq: Two times a day (BID) | ORAL | 0 refills | Status: AC

## 2022-05-21 MED ORDER — CETIRIZINE HCL 10 MG PO TABS: 10.0000 mg | ORAL_TABLET | Freq: Every day | ORAL | 1 refills | Status: DC

## 2022-05-21 MED ORDER — FLUTICASONE PROPIONATE 50 MCG/ACT NA SUSP: 1.0000 | Freq: Every day | NASAL | 1 refills | Status: DC

## 2022-05-21 NOTE — ED Provider Notes (Signed)
[facility]    CSN: [id number] Arrival date & time: 05/21/22  1055    HISTORY   Chief Complaint  Patient presents with   Ear Fullness   HPI Angelica Cruz is a 26 y.o. female. Patient presents to urgent care complaining of a sensation of fullness in her right ear along with some mild discomfort for the past week.  Patient states she caught a cold from her daughter 8 days ago, states initially she started with a really sore throat, pain with swallowing and loss of appetite along with headache but that seems to have gotten a lot better.  Patient denies cough with endorses rhinorrhea.  Patient states she might of had some fever and chills but did not check her temperature.  Patient states her daughter did run a fever, did not eat for a few days, complained of headache when she was sick but seems to be feeling better now.  Patient states she did not have her evaluated when she was ill.  Patient denies dizziness, tinnitus, drainage from her ears.  The history is provided by the patient.   Past Medical History:  Diagnosis Date   Chlamydia    Obesity    Patient Active Problem List   Diagnosis Date Noted   Indication for care in labor and delivery, antepartum 07/04/2021   Anemia affecting pregnancy in second trimester 04/06/2021   Obesity during pregnancy 12/13/2020   Trichomoniasis 10/18/2020   Chlamydia infection affecting pregnancy 09/01/2017   Past Surgical History:  Procedure Laterality Date   NO PAST SURGERIES     OB History     Gravida  2   Para  2   Term  2   Preterm  0   AB  0   Living  2      SAB  0   IAB  0   Ectopic  0   Multiple  0   Live Births  2          Home Medications    Prior to Admission medications   Medication Sig Start Date End Date Taking? Authorizing Provider  cefdinir (OMNICEF) 300 MG capsule Take 1 capsule (300 mg total) by mouth 2 (two) times daily for 10 days. 05/21/22 05/31/22 Yes Theadora Rama Scales, PA-C   cetirizine (ZYRTEC ALLERGY) 10 MG tablet Take 1 tablet (10 mg total) by mouth at bedtime. 05/21/22 11/17/22 Yes Theadora Rama Scales, PA-C  fluconazole (DIFLUCAN) 150 MG tablet Take 1 tablet on day 4 of antibiotics.  Take second tablet 3 days later. 05/21/22  Yes Theadora Rama Scales, PA-C  fluticasone (FLONASE) 50 MCG/ACT nasal spray Place 1 spray into both nostrils daily. 05/21/22  Yes Theadora Rama Scales, PA-C  ibuprofen (ADVIL) 600 MG tablet Take 1 tablet (600 mg total) by mouth every 8 (eight) hours as needed for up to 30 doses for fever, headache, mild pain or moderate pain (Inflammation). Take 1 tablet 3 times daily as needed for inflammation of upper airways and/or pain. 05/21/22  Yes Theadora Rama Scales, PA-C  Prenatal Vit-Fe Fumarate-FA (PRENATAL VITAMINS PO) Take 1 tablet by mouth daily.    [provider]   Family History Family History  Problem Relation Age of Onset   Hypertension Mother    Social History Social History   Tobacco Use   Smoking status: Never   Smokeless tobacco: Never  Vaping Use   Vaping Use: Never used  Substance Use Topics   Alcohol use: Not Currently  Comment: occasionally   Drug use: No   Allergies   Patient has no known allergies.  Review of Systems Review of Systems Pertinent findings noted in history of present illness.   Physical Exam Triage Vital Signs ED Triage Vitals  Enc Vitals Group     BP 09/30/21 0827 (!) 147/82     Pulse Rate 09/30/21 0827 72     Resp 09/30/21 0827 18     Temp 09/30/21 0827 98.3 F (36.8 C)     Temp Source 09/30/21 0827 Oral     SpO2 09/30/21 0827 98 %     Weight --      Height --      Head Circumference --      Peak Flow --      Pain Score 09/30/21 0826 5     Pain Loc --      Pain Edu? --      Excl. in GC? --   No data found.  Updated Vital Signs BP 116/81 (BP Location: Right Arm)   Pulse 80   Temp 98 F (36.7 C) (Oral)   Resp 20   SpO2 98%   Physical Exam Vitals and nursing  note reviewed.  Constitutional:      General: She is not in acute distress.    Appearance: Normal appearance. She is not ill-appearing.  HENT:     Head: Normocephalic and atraumatic.     Salivary Glands: Right salivary gland is diffusely enlarged and tender. Left salivary gland is diffusely enlarged and tender.     Right Ear: Hearing, ear canal and external ear normal. No drainage. No middle ear effusion. There is no impacted cerumen. Tympanic membrane is injected, erythematous and retracted.     Left Ear: Hearing, ear canal and external ear normal. No drainage.  No middle ear effusion. There is no impacted cerumen. Tympanic membrane is bulging. Tympanic membrane is not injected or erythematous.     Ears:     Comments: Bilateral EACs with mild erythema, left TM bulging with clear fluid    Nose: Mucosal edema and rhinorrhea present. No nasal deformity, septal deviation, signs of injury, nasal tenderness or congestion. Rhinorrhea is clear.     Right Nostril: Occlusion present. No foreign body, epistaxis or septal hematoma.     Left Nostril: Occlusion present. No foreign body, epistaxis or septal hematoma.     Right Turbinates: Enlarged and swollen. Not pale.     Left Turbinates: Enlarged and swollen. Not pale.     Right Sinus: No maxillary sinus tenderness or frontal sinus tenderness.     Left Sinus: No maxillary sinus tenderness or frontal sinus tenderness.     Mouth/Throat:     Lips: Pink. No lesions.     Mouth: Mucous membranes are moist. No oral lesions.     Pharynx: Oropharynx is clear. Uvula midline. Posterior oropharyngeal erythema and uvula swelling present. No pharyngeal swelling or oropharyngeal exudate.     Tonsils: Tonsillar exudate present. 3+ on the right. 3+ on the left.     Comments: Postnasal drip Eyes:     General: Lids are normal.        Right eye: No discharge.        Left eye: No discharge.     Extraocular Movements: Extraocular movements intact.      Conjunctiva/sclera: Conjunctivae normal.     Right eye: Right conjunctiva is not injected.     Left eye: Left conjunctiva is not injected.  Neck:  Trachea: Trachea and phonation normal.  Cardiovascular:     Rate and Rhythm: Normal rate and regular rhythm.     Pulses: Normal pulses.     Heart sounds: Normal heart sounds. No murmur heard.    No friction rub. No gallop.  Pulmonary:     Effort: Pulmonary effort is normal. No accessory muscle usage, prolonged expiration or respiratory distress.     Breath sounds: Normal breath sounds. No stridor, decreased air movement or transmitted upper airway sounds. No decreased breath sounds, wheezing, rhonchi or rales.  Chest:     Chest wall: No tenderness.  Musculoskeletal:        General: Normal range of motion.     Cervical back: Normal range of motion and neck supple. Normal range of motion.  Lymphadenopathy:     Cervical: Cervical adenopathy present.     Right cervical: Superficial cervical adenopathy and posterior cervical adenopathy present.     Left cervical: Superficial cervical adenopathy and posterior cervical adenopathy present.  Skin:    General: Skin is warm and dry.     Findings: No erythema or rash.  Neurological:     General: No focal deficit present.     Mental Status: She is alert and oriented to person, place, and time.  Psychiatric:        Mood and Affect: Mood normal.        Behavior: Behavior normal.     Visual Acuity Right Eye Distance:   Left Eye Distance:   Bilateral Distance:    Right Eye Near:   Left Eye Near:    Bilateral Near:     UC Couse / Diagnostics / Procedures:    EKG  Radiology No results found.  Procedures Procedures (including critical care time)  UC Diagnoses / Final Clinical Impressions(s)   I have reviewed the triage vital signs and the nursing notes.  Pertinent labs & imaging results that were available during my care of the patient were reviewed by me and considered in my medical  decision making (see chart for details).   Final diagnoses:  Acute tonsillitis, unspecified etiology  Acute suppurative otitis media of right ear without spontaneous rupture of tympanic membrane, recurrence not specified  Allergic rhinitis, unspecified seasonality, unspecified trigger   Rapid test for strep was performed given history provided by patient and physical exam findings, rapid strep test was negative, throat culture is pending.  Patient provided with a prescription for Omnicef to treat her for acute otitis media.  Patient also provided with allergy medications and ibuprofen for symptomatic relief.  Return precautions advised.  ED Prescriptions     Medication Sig Dispense Auth. Provider   cefdinir (OMNICEF) 300 MG capsule Take 1 capsule (300 mg total) by mouth 2 (two) times daily for 10 days. 20 capsule Theadora Rama Scales, PA-C   fluconazole (DIFLUCAN) 150 MG tablet Take 1 tablet on day 4 of antibiotics.  Take second tablet 3 days later. 2 tablet Theadora Rama Scales, PA-C   ibuprofen (ADVIL) 600 MG tablet Take 1 tablet (600 mg total) by mouth every 8 (eight) hours as needed for up to 30 doses for fever, headache, mild pain or moderate pain (Inflammation). Take 1 tablet 3 times daily as needed for inflammation of upper airways and/or pain. 30 tablet Theadora Rama Scales, PA-C   fluticasone (FLONASE) 50 MCG/ACT nasal spray Place 1 spray into both nostrils daily. 48 mL Theadora Rama Scales, PA-C   cetirizine (ZYRTEC ALLERGY) 10 MG tablet Take 1 tablet (  10 mg total) by mouth at bedtime. 90 tablet Theadora RamaMorgan, Melody Cirrincione Scales, [place]-C      PDMP not reviewed this encounter.  Pending results:  Labs Reviewed  CULTURE, GROUP A STREP [facility]  POCT RAPID STREP A (OFFICE)    Medications Ordered in UC: Medications - No data to display  Disposition Upon Discharge:  Condition: stable for discharge home Home: take medications as prescribed; routine discharge instructions as discussed;  follow up as advised.  Patient presented with an acute illness with associated systemic symptoms and significant discomfort requiring urgent management. In my opinion, this is a condition that a prudent lay person (someone who possesses an average knowledge of health and medicine) may potentially expect to result in complications if not addressed urgently such as respiratory distress, impairment of bodily function or dysfunction of bodily organs.   Routine symptom specific, illness specific and/or disease specific instructions were discussed with the patient and/or caregiver at length.   As such, the patient has been evaluated and assessed, work-up was performed and treatment was provided in alignment with urgent care protocols and evidence based medicine.  Patient/parent/caregiver has been advised that the patient may require follow up for further testing and treatment if the symptoms continue in spite of treatment, as clinically indicated and appropriate.  If the patient was tested for COVID-19, Influenza and/or RSV, then the patient/parent/guardian was advised to isolate at home pending the results of his/her diagnostic coronavirus test and potentially longer if they're positive. I have also advised pt that if his/her COVID-19 test returns positive, it's recommended to self-isolate for at least 10 days after symptoms first appeared AND until fever-free for 24 hours without fever reducer AND other symptoms have improved or resolved. Discussed self-isolation recommendations as well as instructions for household member/close contacts as per the [facility] and  DHHS, and also gave patient the COVID packet with this information.  Patient/parent/caregiver has been [facility] in 3-5 days if no better; to PCP or the Emergency Department if new signs and symptoms develop, or if the current signs or symptoms continue to change or worsen for further workup, evaluation and treatment as  clinically indicated and appropriate  The patient will follow up with their current PCP if and as advised. If the patient does not currently have a PCP we will assist them in obtaining one.   The patient may need specialty follow up if the symptoms continue, in spite of conservative treatment and management, for further workup, evaluation, consultation and treatment as clinically indicated and appropriate.  Patient/parent/caregiver verbalized understanding and agreement of plan as discussed.  All questions were addressed during visit.  Please see discharge instructions below for further details of plan.  Discharge Instructions:   Discharge Instructions      Please see the list below for recommended medications, dosages and frequencies to provide relief of your current symptoms:    Omnicef (cefdinir):  1 capsule twice daily for 10 days, you can take it with or without food.  This antibiotic can cause upset stomach, this will resolve once antibiotics are complete.  You are welcome to use a probiotic, eat yogurt, take Imodium while taking this medication.  Please avoid other systemic medications such as Maalox, Pepto-Bismol or milk of magnesia as they can interfere with your body's ability to absorb the antibiotics.   Diflucan (fluconazole): As you may or may not be aware, taking antibiotics can often cause patients to develop a vaginal yeast infection.  For  this reason, I have provided you with a prescription for Diflucan.  Please take the first Diflucan tablet on day 3 or 4 of your antibiotic therapy, and take the second Diflucan tablet 3 days later.  You do not need to pick up this prescription or take this medication unless you develop symptoms of vaginal yeast infection including thick, white vaginal discharge and/or vaginal itching.  This prescription has been provided as a Research officer, political party and for your convenience.  It is very important that you take antibiotics as prescribed.  If you skip doses or  do not complete the full course of antibiotics, you put yourself at significant risk of recurrent infection which can often be worse than your initial infection.   Your symptoms and my physical exam findings are also concerning for exacerbation of your underlying allergies.  Please see the list below for recommended allergy medications, dosages and frequencies to provide relief of current symptoms:     Zyrtec (cetirizine): This is an excellent second-generation antihistamine that helps to reduce respiratory inflammatory response to environmental allergens.  In some patients, this medication can cause daytime sleepiness so I recommend that you take 1 tablet daily at bedtime.     Flonase (fluticasone): This is a steroid nasal spray that you use once daily, 1 spray in each nare.  This medication does not work well if you decide to use it only used as you feel you need to, it works best used on a daily basis.  After 3 to 5 days of use, you will notice significant reduction of the inflammation and mucus production that is currently being caused by exposure to allergens, whether seasonal or environmental.  The most common side effect of this medication is nosebleeds.  If you experience a nosebleed, please discontinue use for 1 week, then feel free to resume.  I have provided you with a prescription.     Please follow-up within the next 5-7 days either with your primary care provider or urgent care if your symptoms do not resolve.  If you do not have a primary care provider, we will assist you in finding one.   Thank you for visiting urgent care today.  We appreciate the opportunity to participate in your care.     This office note has been dictated using Teaching laboratory technician.  Unfortunately, and despite my best efforts, this method of dictation can sometimes lead to occasional typographical or grammatical errors.  I apologize in advance if this occurs.     Theadora Rama Scales,  PA-C 05/21/22 1207

## 2022-05-21 NOTE — Discharge Instructions (Signed)
Please see the list below for recommended medications, dosages and frequencies to provide relief of your current symptoms:    Omnicef (cefdinir):  1 capsule twice daily for 10 days, you can take it with or without food.  This antibiotic can cause upset stomach, this will resolve once antibiotics are complete.  You are welcome to use a probiotic, eat yogurt, take Imodium while taking this medication.  Please avoid other systemic medications such as Maalox, Pepto-Bismol or milk of magnesia as they can interfere with your body's ability to absorb the antibiotics.   Diflucan (fluconazole): As you may or may not be aware, taking antibiotics can often cause patients to develop a vaginal yeast infection.  For this reason, I have provided you with a prescription for Diflucan.  Please take the first Diflucan tablet on day 3 or 4 of your antibiotic therapy, and take the second Diflucan tablet 3 days later.  You do not need to pick up this prescription or take this medication unless you develop symptoms of vaginal yeast infection including thick, white vaginal discharge and/or vaginal itching.  This prescription has been provided as a Research officer, political party and for your convenience.  It is very important that you take antibiotics as prescribed.  If you skip doses or do not complete the full course of antibiotics, you put yourself at significant risk of recurrent infection which can often be worse than your initial infection.   Your symptoms and my physical exam findings are also concerning for exacerbation of your underlying allergies.  Please see the list below for recommended allergy medications, dosages and frequencies to provide relief of current symptoms:     Zyrtec (cetirizine): This is an excellent second-generation antihistamine that helps to reduce respiratory inflammatory response to environmental allergens.  In some patients, this medication can cause daytime sleepiness so I recommend that you take 1 tablet daily at  bedtime.     Flonase (fluticasone): This is a steroid nasal spray that you use once daily, 1 spray in each nare.  This medication does not work well if you decide to use it only used as you feel you need to, it works best used on a daily basis.  After 3 to 5 days of use, you will notice significant reduction of the inflammation and mucus production that is currently being caused by exposure to allergens, whether seasonal or environmental.  The most common side effect of this medication is nosebleeds.  If you experience a nosebleed, please discontinue use for 1 week, then feel free to resume.  I have provided you with a prescription.     Please follow-up within the next 5-7 days either with your primary care provider or urgent care if your symptoms do not resolve.  If you do not have a primary care provider, we will assist you in finding one.   Thank you for visiting urgent care today.  We appreciate the opportunity to participate in your care.

## 2022-05-21 NOTE — ED Triage Notes (Signed)
Pt here with right ear fullness and discomfort x 1 week.

## 2022-05-24 LAB — CULTURE, GROUP A STREP (THRC)

## 2022-07-31 ENCOUNTER — Ambulatory Visit: Payer: Medicaid Other | Admitting: Obstetrics and Gynecology

## 2022-08-29 ENCOUNTER — Encounter: Payer: Self-pay | Admitting: Student

## 2022-08-30 ENCOUNTER — Ambulatory Visit: Payer: Medicaid Other | Admitting: Obstetrics and Gynecology

## 2023-03-12 ENCOUNTER — Ambulatory Visit: Payer: Medicaid Other | Admitting: Obstetrics and Gynecology

## 2024-03-11 ENCOUNTER — Other Ambulatory Visit: Payer: Self-pay

## 2024-03-11 ENCOUNTER — Encounter: Payer: Self-pay | Admitting: Obstetrics and Gynecology

## 2024-03-11 ENCOUNTER — Ambulatory Visit (INDEPENDENT_AMBULATORY_CARE_PROVIDER_SITE_OTHER): Payer: Medicaid Other | Admitting: Obstetrics and Gynecology

## 2024-03-11 VITALS — BP 125/86 | HR 76 | Wt 243.0 lb

## 2024-03-11 DIAGNOSIS — Z3046 Encounter for surveillance of implantable subdermal contraceptive: Secondary | ICD-10-CM | POA: Diagnosis not present

## 2024-03-11 NOTE — Patient Instructions (Signed)
 Your NEXPLANON implant was just removed Here is some helpful information on what you can expect and how to care for your removal site. 24 Hours wear your top bandage The compression bandage helps minimize bruising.  3-5 Days keep your implant site covered While the insertion site is healing, keep the area covered with a smaller bandage.

## 2024-03-11 NOTE — Progress Notes (Unsigned)
    GYNECOLOGY VISIT  Patient name: Angelica Cruz MRN [id number]  Date of birth: 1996/10/26 Chief Complaint:   nexplanon removal  History:  Angelica Cruz is a 28 y.o. [phone] being seen today for nexplanon removal.  Requests removal due to having irregular cycles with the device in place. Irregular mbleeding and owuld hope it returns to normal. Declines any contraception today. Also reports feeling her "hormones are all over the place" Sexually active currently with female partner. Prior nexplanon had normal cycles.   Past Medical History:  Diagnosis Date   Chlamydia    Obesity     Past Surgical History:  Procedure Laterality Date   NO PAST SURGERIES      The following portions of the patient's history were reviewed and updated as appropriate: allergies, current medications, past family history, past medical history, past social history, past surgical history and problem list.   Health Maintenance:   Last pap     Component Value Date/Time   DIAGPAP  10/13/2020 0959    - Negative for intraepithelial lesion or malignancy (NILM)   ADEQPAP  10/13/2020 0959    Satisfactory for evaluation; transformation zone component PRESENT.   Last mammogram: n/a   Review of Systems:  Pertinent items are noted in HPI. Comprehensive review of systems was otherwise negative.   Objective:  Physical Exam BP 125/86   Pulse 76   Wt 243 lb (110.2 kg)   LMP 02/16/2024 (Approximate)   BMI 43.05 kg/m    Physical Exam Vitals and nursing note reviewed.  Constitutional:      Appearance: Normal appearance.  HENT:     Head: Normocephalic and atraumatic.  Pulmonary:     Effort: Pulmonary effort is normal.  Skin:    General: Skin is warm and dry.  Neurological:     General: No focal deficit present.     Mental Status: She is alert.  Psychiatric:        Mood and Affect: Mood normal.        Behavior: Behavior normal.        Thought Content: Thought content normal.         Judgment: Judgment normal.     NEXPLANON REMOVAL The risks (including infection, bleeding, pain, and uterine perforation) and benefits of the procedure were explained to the patient and Written informed consent was obtained.   Device was palpated in left upper arm. After time out, the skin was cleaned with alcohol and infiltrated with 1cc of 1% lidocaine with epinephrine was used to infiltrate the skin and subcutaneous tissue deep to the device. The area was cleaned with betadine x3.  Using an 11 blade, the skin was incised and the implant was removed intact.  The implant was shown to the patient.  The skin was cleaned, incision covered with Steri-Strips, and an adhesive bandage.  Arm was wrapped and post procedure instructions provided to the patient.      Assessment & Plan:   1. Nexplanon removal (Primary) Now s/p uncomplicated nexplanon removal. Declines alternate contraception at this time. Reviewed use of hormonal or barrier methods to prevent pregnancy. Noted that menses should return to baseline.    Routine preventative health maintenance measures emphasized.  Lorriane Shire, MD [facility] for [facility], [facility]

## 2024-03-12 ENCOUNTER — Telehealth: Payer: Self-pay | Admitting: Family Medicine

## 2024-03-12 NOTE — Telephone Encounter (Signed)
 Attempted to reach patient to schedule her an appointment with Dr. Briscoe Deutscher for her annual visit. Left a message for her to call to schedule.

## 2024-03-12 NOTE — Telephone Encounter (Signed)
-----   Message from Bode sent at 03/12/2024 10:07 AM EDT ----- Regarding: annual/pap Hello,  I noticed pap was not up to date, offer follw up for annual with pap - can be with any provider.  Thanks,  Ajewole

## 2024-12-23 ENCOUNTER — Ambulatory Visit
Admission: EM | Admit: 2024-12-23 | Discharge: 2024-12-23 | Disposition: A | Discharge status: Home / Self Care | Attending: Emergency Medicine | Admitting: Emergency Medicine

## 2024-12-23 DIAGNOSIS — R5383 Other fatigue: Secondary | ICD-10-CM | POA: Diagnosis not present

## 2024-12-23 DIAGNOSIS — Z3201 Encounter for pregnancy test, result positive: Secondary | ICD-10-CM

## 2024-12-23 DIAGNOSIS — Z3A01 Less than 8 weeks gestation of pregnancy: Secondary | ICD-10-CM

## 2024-12-23 LAB — POCT URINE PREGNANCY: Preg Test, Ur: POSITIVE — AB

## 2024-12-23 NOTE — ED Provider Notes (Signed)
 " CAY RALPH PELT    CSN: [id number] Arrival date & time: 12/23/24  1103      History   Chief Complaint Chief Complaint  Patient presents with   Possible Pregnancy    HPI Angelica Cruz is a 29 y.o. female.  Patient presents with request for pregnancy test.  LMP 11/01/2024.  Patient reports positive pregnancy test at home.  She has had some fatigue, nausea, breast tenderness.  No fever, abdominal pain, vaginal bleeding, vaginal discharge, dysuria, hematuria, flank pain.  Patient has started on prenatal vitamins and plans to schedule an appointment with her OB/GYN.  The history is provided by the patient and medical records.    Past Medical History:  Diagnosis Date   Chlamydia    Obesity     Patient Active Problem List   Diagnosis Date Noted   Indication for care in labor and delivery, antepartum 07/04/2021   Anemia affecting pregnancy in second trimester 04/06/2021   Obesity during pregnancy 12/13/2020   Trichomoniasis 10/18/2020   Chlamydia infection affecting pregnancy 09/01/2017    Past Surgical History:  Procedure Laterality Date   NO PAST SURGERIES      OB History     Gravida  3   Para  2   Term  2   Preterm  0   AB  0   Living  2      SAB  0   IAB  0   Ectopic  0   Multiple  0   Live Births  2            Home Medications    Prior to Admission medications  Medication Sig Start Date End Date Taking? Authorizing Provider  cetirizine  (ZYRTEC  ALLERGY) 10 MG tablet Take 1 tablet (10 mg total) by mouth at bedtime. 05/21/22 11/17/22  Joesph Shaver Scales, PA-C  fluticasone  (FLONASE ) 50 MCG/ACT nasal spray Place 1 spray into both nostrils daily. 05/21/22   Joesph Shaver Scales, PA-C  ibuprofen  (ADVIL ) 600 MG tablet Take 1 tablet (600 mg total) by mouth every 8 (eight) hours as needed for up to 30 doses for fever, headache, mild pain or moderate pain (Inflammation). Take 1 tablet 3 times daily as needed for inflammation of upper  airways and/or pain. 05/21/22   Joesph Shaver Scales, PA-C  Prenatal Vit-Fe Fumarate-FA (PRENATAL VITAMINS PO) Take 1 tablet by mouth daily.    [provider]    Family History Family History  Problem Relation Age of Onset   Hypertension Mother     Social History Social History[1]   Allergies   Patient has no known allergies.   Review of Systems Review of Systems  Constitutional:  Positive for fatigue. Negative for chills and fever.  Gastrointestinal:  Positive for nausea. Negative for abdominal pain, constipation, diarrhea and vomiting.  Genitourinary:  Negative for dysuria, flank pain, hematuria, pelvic pain, vaginal bleeding and vaginal discharge.     Physical Exam Triage Vital Signs ED Triage Vitals  Encounter Vitals Group     BP 12/23/24 1119 119/83     Girls Systolic BP Percentile --      Girls Diastolic BP Percentile --      Boys Systolic BP Percentile --      Boys Diastolic BP Percentile --      Pulse Rate 12/23/24 1119 89     Resp 12/23/24 1119 18     Temp 12/23/24 1119 97.8 F (36.6 C)     Temp src --  SpO2 12/23/24 1119 99 %     Weight --      Height --      Head Circumference --      Peak Flow --      Pain Score 12/23/24 1127 0     Pain Loc --      Pain Education --      Exclude from Growth Chart --    No data found.  Updated Vital Signs BP 119/83   Pulse 89   Temp 97.8 F (36.6 C)   Resp 18   LMP 11/01/2024   SpO2 99%   Visual Acuity Right Eye Distance:   Left Eye Distance:   Bilateral Distance:    Right Eye Near:   Left Eye Near:    Bilateral Near:     Physical Exam Constitutional:      General: She is not in acute distress. HENT:     Mouth/Throat:     Mouth: Mucous membranes are moist.  Cardiovascular:     Rate and Rhythm: Normal rate and regular rhythm.     Heart sounds: Normal heart sounds.  Pulmonary:     Effort: Pulmonary effort is normal. No respiratory distress.     Breath sounds: Normal breath  sounds.  Abdominal:     General: Bowel sounds are normal.     Palpations: Abdomen is soft.     Tenderness: There is no abdominal tenderness. There is no right CVA tenderness, left CVA tenderness, guarding or rebound.  Neurological:     Mental Status: She is alert.      UC Treatments / Results  Labs (all labs ordered are listed, but only abnormal results are displayed) Labs Reviewed  POCT URINE PREGNANCY - Abnormal; Notable for the following components:      Result Value   Preg Test, Ur Positive (*)    All other components within normal limits    EKG   Radiology No results found.  Procedures Procedures (including critical care time)  Medications Ordered in UC Medications - No data to display  Initial Impression / Assessment and Plan / UC Course  I have reviewed the triage vital signs and the nursing notes.  Pertinent labs & imaging results that were available during my care of the patient were reviewed by me and considered in my medical decision making (see chart for details).    Fatigue, positive pregnancy test, less than [redacted] weeks gestation of pregnancy.  Afebrile and vital signs are stable.  Urine pregnancy test positive.  Patient has started on prenatal vitamins and plans to schedule an appointment with her OB/GYN.  Education provided today on first trimester pregnancy.  Instructed patient to avoid OTC medications unless directed by her OB/GYN.  Instructed her to follow-up with her OB/GYN.  She agrees to plan of care.  Final Clinical Impressions(s) / UC Diagnoses   Final diagnoses:  Fatigue, unspecified type  Positive pregnancy test  Less than [redacted] weeks gestation of pregnancy     Discharge Instructions      Your pregnancy test is positive.  Take prenatal vitamins daily.  Avoid over-the-counter medications unless directed to take them by your OB/GYN.    Schedule an appointment with your OB/GYN.     ED Prescriptions   None    PDMP not reviewed this  encounter.    [1]  Social History Tobacco Use   Smoking status: Never   Smokeless tobacco: Never  Vaping Use   Vaping status: Never Used  Substance Use Topics   Alcohol use: Not Currently    Comment: occasionally   Drug use: No     Corlis Burnard DEL, NP 12/23/24 1142  "

## 2024-12-23 NOTE — ED Triage Notes (Signed)
 Patient presents to UC for pregnancy test. States ob-gyn clinic requires a positive preg test on file to set up appt. Reports LMP 10/2024, fatigue, increased appetite, nausea, and breast tenderness.

## 2024-12-23 NOTE — Discharge Instructions (Addendum)
 Your pregnancy test is positive.  Take prenatal vitamins daily.  Avoid over-the-counter medications unless directed to take them by your OB/GYN.    Schedule an appointment with your OB/GYN.

## 2025-01-02 ENCOUNTER — Telehealth

## 2025-01-02 DIAGNOSIS — Z3689 Encounter for other specified antenatal screening: Secondary | ICD-10-CM

## 2025-01-02 DIAGNOSIS — O3680X0 Pregnancy with inconclusive fetal viability, not applicable or unspecified: Secondary | ICD-10-CM

## 2025-01-02 DIAGNOSIS — Z3480 Encounter for supervision of other normal pregnancy, unspecified trimester: Secondary | ICD-10-CM | POA: Insufficient documentation

## 2025-01-02 NOTE — Patient Instructions (Signed)
 First Trimester of Pregnancy: What to Know  The first trimester of pregnancy starts on the first day of your last monthly period until the end of week 13. This is months 1 through 3 of pregnancy. A week after a sperm fertilizes an egg, the egg will implant into the wall of the uterus and begin to develop into a baby. Body changes during your first trimester Your body goes through many changes during pregnancy. The changes usually return to normal after your baby is born. Physical changes Your breasts may grow larger and may hurt. The area around your nipples may get darker. Your periods will stop. Your hair and nails may grow faster. You may pee more often. Health changes You may tire easily. Your gums may bleed and may be sensitive when you brush and floss. You may not feel hungry. You may have heartburn. You may throw up or feel like you may throw up. You may want to eat some foods, but not others. You may have headaches. You may have trouble pooping (constipation). Other changes Your emotions may change from day to day. You may have more dreams. Follow these instructions at home: Medicines Talk to your health care provider if you're taking medicines. Ask if the medicines are safe to take during pregnancy. Your provider may change the medicines that you take. Do not take any medicines unless told to by your provider. Take a prenatal vitamin that has at least 600 micrograms (mcg) of folic acid. Do not use herbal medicines, illegal substances, or medicines that are not approved by your provider. Eating and drinking While you're pregnant your body needs extra food for your growing baby. Talk with your provider about what to eat while pregnant. Activity Most women are able to exercise during pregnancy. Exercises may need to change as your pregnancy goes on. Talk to your provider about your activities and exercise routines. Relieving pain and discomfort Wear a good, supportive bra if  your breasts hurt. Rest with your legs raised if you have leg cramps or low back pain. Safety Wear your seatbelt at all times when you're in a car. Talk to your provider if someone hits you, hurts you, or yells at you. Talk with your provider if you're feeling sad or have thoughts of hurting yourself. Lifestyle Certain things can be harmful while you're pregnant. Follow these rules: Do not use hot tubs, steam rooms, or saunas. Do not douche. Do not use tampons or scented pads. Do not drink alcohol,smoke, vape, or use products with nicotine or tobacco in them. If you need help quitting, talk with your provider. Avoid cat litter boxes and soil used by cats. These things carry germs that can cause harm to your pregnancy and your baby. General instructions Keep all follow-up visits. It helps you and your unborn baby stay as healthy as possible. Write down your questions. Take them to your visits. Your provider will: Talk with you about your overall health. Give you advice or refer you to specialists who can help with different needs, including: Prenatal education classes. Mental health and counseling. Foods and healthy eating. Ask for help if you need help with food. Call your dentist and ask to be seen. Brush your teeth with a soft toothbrush. Floss gently. Where to find more information American Pregnancy Association: americanpregnancy.org [employer]: acog.org Office on [employer] Health: travell[place].ca Contact a health care provider if: You feel dizzy, faint, or have a fever. You vomit or have watery poop (  diarrhea) for 2 days or more. You have abnormal discharge or bleeding from your vagina. You have pain when you pee or your pee smells bad. You have cramps, pain, or pressure in your belly area. Get help right away if: You have trouble breathing or chest pain. You have any kind of injury, such as from a fall or a car crash. These symptoms may  be an emergency. Get help right away. Call 911. Do not wait to see if the symptoms will go away. Do not drive yourself to the hospital. This information is not intended to replace advice given to you by your health care provider. Make sure you discuss any questions you have with your health care provider. Document Revised: 09/29/2024 Document Reviewed: 03/23/2023 Elsevier Patient Education  2025 [employer].  Common Medications Safe in Pregnancy  Acne:      Constipation:  Benzoyl Peroxide     Colace  Clindamycin      Dulcolax Suppository  Topica Erythromycin     Fibercon  Salicylic Acid      Metamucil         Miralax  AVOID:        Senakot   Accutane    Cough:  Retin-A       Cough Drops  Tetracycline      Phenergan  w/ Codeine  if Rx  Minocycline      Robitussin (Plain & DM)  Antibiotics:     Crabs/Lice:  Ceclor       RID  Cephalosporins    AVOID:  E-Mycins      Kwell  Keflex  Macrobid /Macrodantin    Diarrhea:  Penicillin      Kao-Pectate  Zithromax       Imodium AD         PUSH FLUIDS AVOID:       Cipro     Fever:  Tetracycline      Tylenol  (Regular or Extra  Minocycline       Strength)  Levaquin      Extra Strength-Do not          Exceed 8 tabs/24 hrs Caffeine:        200mg /day (equiv. To 1 cup of coffee or  approx. 3 12 oz sodas)         Gas: Cold/Hayfever:       Gas-X  Benadryl       Mylicon  Claritin       Phazyme  **Claritin-D        Chlor-Trimeton    Headaches:  Dimetapp      ASA-Free Excedrin  Drixoral-Non-Drowsy     Cold Compress  Mucinex (Guaifenasin)     Tylenol  (Regular or Extra  Sudafed/Sudafed-12 Hour     Strength)  **Sudafed PE Pseudoephedrine   Tylenol  Cold & Sinus     Vicks Vapor Rub  Zyrtec  **AVOID if Problems With Blood Pressure         Heartburn: Avoid lying down for at least 1 hour after meals  Aciphex      Maalox     Rash:  Milk of Magnesia     Benadryl     Mylanta       1% Hydrocortisone Cream  Pepcid  Pepcid Complete   Sleep  Aids:  Prevacid      Ambien    Prilosec       Benadryl   Rolaids       Chamomile Tea  Tums (Limit 4/day)     Unisom  Tylenol  PM         Warm milk-add vanilla or  Hemorrhoids:       Sugar for taste  Anusol/Anusol H.C.  (RX: Analapram 2.5%)  Sugar Substitutes:  Hydrocortisone OTC     Ok in moderation  Preparation H      Tucks        Vaseline lotion applied to tissue with wiping    Herpes:     Throat:  Acyclovir      Oragel  Famvir  Valtrex     Vaccines:         Flu Shot Leg Cramps:       *Gardasil  Benadryl       Hepatitis A         Hepatitis B Nasal Spray:       Pneumovax  Saline Nasal Spray     Polio Booster         Tetanus Nausea:       Tuberculosis test or PPD  Vitamin B6 25 mg TID   AVOID:    Dramamine      *Gardasil  Emetrol       Live Poliovirus  Ginger Root 250 mg QID    MMR (measles, mumps &  High Complex Carbs @ Bedtime    rebella)  Sea Bands-Accupressure    Varicella (Chickenpox)  Unisom 1/2 tab TID     *No known complications           If received before Pain:         Known pregnancy;   Darvocet       Resume series after  Lortab        Delivery  Percocet    Yeast:   Tramadol      Femstat  Tylenol  3      Gyne-lotrimin  Ultram       Monistat  Vicodin           MISC:         All Sunscreens           Hair Coloring/highlights          Insect Repellant's          (Including DEET)         Mystic Tans   Commonly Asked Questions During Pregnancy  Cats: A parasite can be excreted in cat feces.  To avoid exposure you need to have another person empty the little box.  If you must empty the litter box you will need to wear gloves.  Wash your hands after handling your cat.  This parasite can also be found in raw or undercooked meat so this should also be avoided.  Colds, Sore Throats, Flu: Please check your medication sheet to see what you can take for symptoms.  If your symptoms are unrelieved by these medications please call the office.  Dental Work: Most  any dental work agricultural consultant recommends is permitted.  X-rays should only be taken during the first trimester if absolutely necessary.  Your abdomen should be shielded with a lead apron during all x-rays.  Please notify your provider prior to receiving any x-rays.  Novocaine is fine; gas is not recommended.  If your dentist requires a note from us  prior to dental work please call the office and we will provide one for you.  Exercise: Exercise is an important part of staying healthy during your pregnancy.  You may continue most exercises you were accustomed to prior to pregnancy.  Later  in your pregnancy you will most likely notice you have difficulty with activities requiring balance like riding a bicycle.  It is important that you listen to your body and avoid activities that put you at a higher risk of falling.  Adequate rest and staying well hydrated are a must!  If you have questions about the safety of specific activities ask your provider.    Exposure to Children with illness: Try to avoid obvious exposure; report any symptoms to us  when noted,  If you have chicken pos, red measles or mumps, you should be immune to these diseases.   Please do not take any vaccines while pregnant unless you have checked with your OB provider.  Fetal Movement: After 28 weeks we recommend you do kick counts twice daily.  Lie or sit down in a calm quiet environment and count your baby movements kicks.  You should feel your baby at least 10 times per hour.  If you have not felt 10 kicks within the first hour get up, walk around and have something sweet to eat or drink then repeat for an additional hour.  If count remains less than 10 per hour notify your provider.  Fumigating: Follow your pest control agent's advice as to how long to stay out of your home.  Ventilate the area well before re-entering.  Hemorrhoids:   Most over-the-counter preparations can be used during pregnancy.  Check your medication to see what is  safe to use.  It is important to use a stool softener or fiber in your diet and to drink lots of liquids.  If hemorrhoids seem to be getting worse please call the office.   Hot Tubs:  Hot tubs Jacuzzis and saunas are not recommended while pregnant.  These increase your internal body temperature and should be avoided.  Intercourse:  Sexual intercourse is safe during pregnancy as long as you are comfortable, unless otherwise advised by your provider.  Spotting may occur after intercourse; report any bright red bleeding that is heavier than spotting.  Labor:  If you know that you are in labor, please go to the hospital.  If you are unsure, please call the office and let us  help you decide what to do.  Lifting, straining, etc:  If your job requires heavy lifting or straining please check with your provider for any limitations.  Generally, you should not lift items heavier than that you can lift simply with your hands and arms (no back muscles)  Painting:  Paint fumes do not harm your pregnancy, but may make you ill and should be avoided if possible.  Latex or water based paints have less odor than oils.  Use adequate ventilation while painting.  Permanents & Hair Color:  Chemicals in hair dyes are not recommended as they cause increase hair dryness which can increase hair loss during pregnancy.   Highlighting and permanents are allowed.  Dye may be absorbed differently and permanents may not hold as well during pregnancy.  Sunbathing:  Use a sunscreen, as skin burns easily during pregnancy.  Drink plenty of fluids; avoid over heating.  Tanning Beds:  Because their possible side effects are still unknown, tanning beds are not recommended.  Ultrasound Scans:  Routine ultrasounds are performed at approximately 20 weeks.  You will be able to see your baby's general anatomy an if you would like to know the gender this can usually be determined as well.  If it is questionable when you conceived you may  also receive  an ultrasound early in your pregnancy for dating purposes.  Otherwise ultrasound exams are not routinely performed unless there is a medical necessity.  Although you can request a scan we ask that you pay for it when conducted because insurance does not cover  patient request scans.  Work: If your pregnancy proceeds without complications you may work until your due date, unless your physician or employer advises otherwise.  Round Ligament Pain/Pelvic Discomfort:  Sharp, shooting pains not associated with bleeding are fairly common, usually occurring in the second trimester of pregnancy.  They tend to be worse when standing up or when you remain standing for long periods of time.  These are the result of pressure of certain pelvic ligaments called round ligaments.  Rest, Tylenol  and heat seem to be the most effective relief.  As the womb and fetus grow, they rise out of the pelvis and the discomfort improves.  Please notify the office if your pain seems different than that described.  It may represent a more serious condition.

## 2025-01-02 NOTE — Progress Notes (Signed)
 New OB Intake  I connected with  Angelica Cruz on 01/02/25 at  9:15 AM EST by MyChart Video Visit and verified that I am speaking with the correct person using two identifiers. Nurse is located at [facility] and pt is located at work.  I discussed the limitations, risks, security and privacy concerns of performing an evaluation and management service by telephone and the availability of in person appointments. I also discussed with the patient that there may be a patient responsible charge related to this service. The patient expressed understanding and agreed to proceed.  I explained I am completing New OB Intake today. We discussed her EDD of 08/08/25 that is based on LMP of 11/01/24. Pt is G3/P2. I reviewed her allergies, medications, Medical/Surgical/OB history, and appropriate screenings. There are cats in the home: no. Based on history, this is a/an pregnancy uncomplicated . Her obstetrical history is significant for N/A.  Patient Active Problem List   Diagnosis Date Noted   Supervision of other normal pregnancy, antepartum 01/02/2025    Concerns addressed today: None  Delivery Plans:  Plans to deliver at [facility].  Anatomy US  Explained first scheduled US  will be 01/13/25. Anatomy US  will be scheduled around [redacted] weeks gestational age.  Labs Discussed genetic screening with patient. Patient desires genetic testing to be drawn at new OB visit. Discussed possible labs to be drawn at new OB appointment.  COVID Vaccine Patient has not had COVID vaccine.   Social Determinants of Health Food Insecurity: denies food insecurity  Transportation: Patient denies transportation needs. Childcare: Discussed no children allowed at ultrasound appointments.   First visit review I reviewed new OB appt with pt. I explained she will have blood work and pap smear/pelvic exam if indicated. Explained pt will be seen by Harlene Cisco, CNM at first visit; encounter routed  to appropriate provider.   Beola Skeens, CMA 01/02/2025  9:32 AM

## 2025-01-13 ENCOUNTER — Other Ambulatory Visit

## 2025-01-29 ENCOUNTER — Encounter: Admitting: Certified Nurse Midwife
